# Patient Record
Sex: Female | Born: 1960
Health system: Southern US, Community
[De-identification: ages and names within clinical notes are randomized; demographics above are authoritative.]

## PROBLEM LIST (undated history)

## (undated) DIAGNOSIS — I739 Peripheral vascular disease, unspecified: Secondary | ICD-10-CM

## (undated) DIAGNOSIS — I509 Heart failure, unspecified: Secondary | ICD-10-CM

## (undated) DIAGNOSIS — E785 Hyperlipidemia, unspecified: Secondary | ICD-10-CM

## (undated) HISTORY — DX: Hyperlipidemia, unspecified: E78.5

## (undated) HISTORY — DX: Peripheral vascular disease, unspecified: I73.9

---

## 2020-03-17 ENCOUNTER — Emergency Department (HOSPITAL_COMMUNITY): Payer: Medicare Other

## 2020-03-17 ENCOUNTER — Encounter (HOSPITAL_COMMUNITY)
Admission: EM | Disposition: A | Discharge status: Home Health | Payer: Self-pay | Source: Home / Self Care | Attending: Vascular Surgery

## 2020-03-17 ENCOUNTER — Emergency Department (HOSPITAL_COMMUNITY): Payer: Medicare Other | Admitting: Certified Registered Nurse Anesthetist

## 2020-03-17 ENCOUNTER — Other Ambulatory Visit: Payer: Self-pay

## 2020-03-17 ENCOUNTER — Encounter (HOSPITAL_COMMUNITY): Payer: Self-pay | Admitting: Emergency Medicine

## 2020-03-17 ENCOUNTER — Inpatient Hospital Stay (HOSPITAL_COMMUNITY)
Admission: EM | Admit: 2020-03-17 | Discharge: 2020-03-22 | DRG: 270 | Disposition: A | Discharge status: Home Health | Payer: Medicare Other | Attending: Vascular Surgery | Admitting: Vascular Surgery

## 2020-03-17 DIAGNOSIS — Z79899 Other long term (current) drug therapy: Secondary | ICD-10-CM | POA: Diagnosis not present

## 2020-03-17 DIAGNOSIS — I4892 Unspecified atrial flutter: Secondary | ICD-10-CM | POA: Diagnosis present

## 2020-03-17 DIAGNOSIS — J9601 Acute respiratory failure with hypoxia: Secondary | ICD-10-CM | POA: Diagnosis present

## 2020-03-17 DIAGNOSIS — U071 COVID-19: Secondary | ICD-10-CM | POA: Diagnosis present

## 2020-03-17 DIAGNOSIS — Z8249 Family history of ischemic heart disease and other diseases of the circulatory system: Secondary | ICD-10-CM | POA: Diagnosis not present

## 2020-03-17 DIAGNOSIS — I340 Nonrheumatic mitral (valve) insufficiency: Secondary | ICD-10-CM | POA: Diagnosis not present

## 2020-03-17 DIAGNOSIS — I998 Other disorder of circulatory system: Secondary | ICD-10-CM | POA: Diagnosis present

## 2020-03-17 DIAGNOSIS — I130 Hypertensive heart and chronic kidney disease with heart failure and stage 1 through stage 4 chronic kidney disease, or unspecified chronic kidney disease: Secondary | ICD-10-CM | POA: Diagnosis present

## 2020-03-17 DIAGNOSIS — R7989 Other specified abnormal findings of blood chemistry: Secondary | ICD-10-CM | POA: Diagnosis present

## 2020-03-17 DIAGNOSIS — J1282 Pneumonia due to coronavirus disease 2019: Secondary | ICD-10-CM | POA: Diagnosis present

## 2020-03-17 DIAGNOSIS — N179 Acute kidney failure, unspecified: Secondary | ICD-10-CM | POA: Diagnosis present

## 2020-03-17 DIAGNOSIS — I081 Rheumatic disorders of both mitral and tricuspid valves: Secondary | ICD-10-CM | POA: Diagnosis present

## 2020-03-17 DIAGNOSIS — R7303 Prediabetes: Secondary | ICD-10-CM | POA: Diagnosis present

## 2020-03-17 DIAGNOSIS — R0602 Shortness of breath: Secondary | ICD-10-CM

## 2020-03-17 DIAGNOSIS — N183 Chronic kidney disease, stage 3 unspecified: Secondary | ICD-10-CM | POA: Diagnosis present

## 2020-03-17 DIAGNOSIS — Z23 Encounter for immunization: Secondary | ICD-10-CM

## 2020-03-17 DIAGNOSIS — I8290 Acute embolism and thrombosis of unspecified vein: Secondary | ICD-10-CM | POA: Diagnosis not present

## 2020-03-17 DIAGNOSIS — I361 Nonrheumatic tricuspid (valve) insufficiency: Secondary | ICD-10-CM | POA: Diagnosis not present

## 2020-03-17 DIAGNOSIS — I4891 Unspecified atrial fibrillation: Secondary | ICD-10-CM | POA: Diagnosis not present

## 2020-03-17 DIAGNOSIS — I509 Heart failure, unspecified: Secondary | ICD-10-CM | POA: Diagnosis not present

## 2020-03-17 DIAGNOSIS — D6859 Other primary thrombophilia: Secondary | ICD-10-CM | POA: Diagnosis present

## 2020-03-17 DIAGNOSIS — I82461 Acute embolism and thrombosis of right calf muscular vein: Secondary | ICD-10-CM | POA: Diagnosis present

## 2020-03-17 DIAGNOSIS — I745 Embolism and thrombosis of iliac artery: Principal | ICD-10-CM | POA: Diagnosis present

## 2020-03-17 DIAGNOSIS — I743 Embolism and thrombosis of arteries of the lower extremities: Secondary | ICD-10-CM | POA: Diagnosis present

## 2020-03-17 DIAGNOSIS — I480 Paroxysmal atrial fibrillation: Secondary | ICD-10-CM | POA: Diagnosis present

## 2020-03-17 DIAGNOSIS — I82431 Acute embolism and thrombosis of right popliteal vein: Secondary | ICD-10-CM | POA: Diagnosis present

## 2020-03-17 DIAGNOSIS — I5041 Acute combined systolic (congestive) and diastolic (congestive) heart failure: Secondary | ICD-10-CM | POA: Diagnosis present

## 2020-03-17 DIAGNOSIS — E785 Hyperlipidemia, unspecified: Secondary | ICD-10-CM | POA: Diagnosis present

## 2020-03-17 DIAGNOSIS — I10 Essential (primary) hypertension: Secondary | ICD-10-CM | POA: Diagnosis not present

## 2020-03-17 HISTORY — DX: Heart failure, unspecified: I50.9

## 2020-03-17 HISTORY — PX: THROMBECTOMY OF BYPASS GRAFT FEMORAL- TIBIAL ARTERY: SHX6904

## 2020-03-17 LAB — CBC WITH DIFFERENTIAL/PLATELET
Abs Immature Granulocytes: 0.22 10*3/uL — ABNORMAL HIGH (ref 0.00–0.07)
Basophils Absolute: 0 10*3/uL (ref 0.0–0.1)
Basophils Relative: 0 %
Eosinophils Absolute: 0.1 10*3/uL (ref 0.0–0.5)
Eosinophils Relative: 2 %
HCT: 45.6 % (ref 36.0–46.0)
Hemoglobin: 15 g/dL (ref 12.0–15.0)
Immature Granulocytes: 3 %
Lymphocytes Relative: 11 %
Lymphs Abs: 0.9 10*3/uL (ref 0.7–4.0)
MCH: 33.9 pg (ref 26.0–34.0)
MCHC: 32.9 g/dL (ref 30.0–36.0)
MCV: 102.9 fL — ABNORMAL HIGH (ref 80.0–100.0)
Monocytes Absolute: 0.5 10*3/uL (ref 0.1–1.0)
Monocytes Relative: 6 %
Neutro Abs: 6.5 10*3/uL (ref 1.7–7.7)
Neutrophils Relative %: 78 %
Platelets: 409 10*3/uL — ABNORMAL HIGH (ref 150–400)
RBC: 4.43 MIL/uL (ref 3.87–5.11)
RDW: 12.1 % (ref 11.5–15.5)
WBC: 8.3 10*3/uL (ref 4.0–10.5)
nRBC: 0 % (ref 0.0–0.2)

## 2020-03-17 LAB — BASIC METABOLIC PANEL
Anion gap: 13 (ref 5–15)
BUN: 51 mg/dL — ABNORMAL HIGH (ref 8–23)
CO2: 21 mmol/L — ABNORMAL LOW (ref 22–32)
Calcium: 8.4 mg/dL — ABNORMAL LOW (ref 8.9–10.3)
Chloride: 101 mmol/L (ref 98–111)
Creatinine, Ser: 1.4 mg/dL — ABNORMAL HIGH (ref 0.44–1.00)
GFR, Estimated: 43 mL/min — ABNORMAL LOW (ref >60)
Glucose, Bld: 157 mg/dL — ABNORMAL HIGH (ref 70–99)
Potassium: 3.7 mmol/L (ref 3.5–5.1)
Sodium: 135 mmol/L (ref 135–145)

## 2020-03-17 LAB — PROCALCITONIN: Procalcitonin: 0.54 ng/mL

## 2020-03-17 LAB — PROTIME-INR
INR: 1.3 — ABNORMAL HIGH (ref 0.8–1.2)
Prothrombin Time: 15.4 seconds — ABNORMAL HIGH (ref 11.4–15.2)

## 2020-03-17 LAB — RESP PANEL BY RT-PCR (FLU A&B, COVID) ARPGX2
Influenza A by PCR: NEGATIVE
Influenza B by PCR: NEGATIVE
SARS Coronavirus 2 by RT PCR: POSITIVE — AB

## 2020-03-17 LAB — CK: Total CK: 41 U/L (ref 38–234)

## 2020-03-17 LAB — LACTIC ACID, PLASMA: Lactic Acid, Venous: 2.7 mmol/L (ref 0.5–1.9)

## 2020-03-17 LAB — HIV ANTIBODY (ROUTINE TESTING W REFLEX): HIV Screen 4th Generation wRfx: NONREACTIVE

## 2020-03-17 LAB — HEPATITIS B SURFACE ANTIGEN: Hepatitis B Surface Ag: NONREACTIVE

## 2020-03-17 LAB — APTT: aPTT: 35 seconds (ref 24–36)

## 2020-03-17 LAB — D-DIMER, QUANTITATIVE: D-Dimer, Quant: 18.06 ug/mL-FEU — ABNORMAL HIGH (ref 0.00–0.50)

## 2020-03-17 LAB — LACTATE DEHYDROGENASE: LDH: 617 U/L — ABNORMAL HIGH (ref 98–192)

## 2020-03-17 LAB — BRAIN NATRIURETIC PEPTIDE: B Natriuretic Peptide: 235.9 pg/mL — ABNORMAL HIGH (ref 0.0–100.0)

## 2020-03-17 LAB — GLUCOSE, CAPILLARY: Glucose-Capillary: 175 mg/dL — ABNORMAL HIGH (ref 70–99)

## 2020-03-17 LAB — HEPARIN LEVEL (UNFRACTIONATED): Heparin Unfractionated: 1.44 IU/mL — ABNORMAL HIGH (ref 0.30–0.70)

## 2020-03-17 LAB — C-REACTIVE PROTEIN: CRP: 18.7 mg/dL — ABNORMAL HIGH (ref <1.0)

## 2020-03-17 SURGERY — THROMBECTOMY, BYPASS GRAFT, ARTERIAL, FEMORAL TO TIBIAL
Anesthesia: General | Site: Leg Upper | Laterality: Right

## 2020-03-17 MED ORDER — SIMVASTATIN 20 MG PO TABS
40.0000 mg | ORAL_TABLET | Freq: Every day | ORAL | Status: DC
  Administered 2020-03-17 – 2020-03-21 (×5): 40 mg via ORAL
  Filled 2020-03-17 (×5): qty 2

## 2020-03-17 MED ORDER — PHENYLEPHRINE 40 MCG/ML (10ML) SYRINGE FOR IV PUSH (FOR BLOOD PRESSURE SUPPORT)
PREFILLED_SYRINGE | INTRAVENOUS | Status: AC
  Filled 2020-03-17: qty 10

## 2020-03-17 MED ORDER — METOPROLOL TARTRATE 5 MG/5ML IV SOLN: 2.0000 mg | INTRAVENOUS | Status: DC | PRN

## 2020-03-17 MED ORDER — PREDNISONE 5 MG PO TABS: 50.0000 mg | ORAL_TABLET | Freq: Every day | ORAL | Status: DC

## 2020-03-17 MED ORDER — AMIODARONE HCL IN DEXTROSE 360-4.14 MG/200ML-% IV SOLN
60.0000 mg/h | INTRAVENOUS | Status: AC
  Administered 2020-03-17: 60 mg/h via INTRAVENOUS
  Filled 2020-03-17 (×2): qty 200

## 2020-03-17 MED ORDER — INSULIN ASPART 100 UNIT/ML ~~LOC~~ SOLN: 0.0000 [IU] | Freq: Every day | SUBCUTANEOUS | Status: DC

## 2020-03-17 MED ORDER — VASOPRESSIN 20 UNIT/ML IV SOLN
INTRAVENOUS | Status: AC
  Filled 2020-03-17: qty 1

## 2020-03-17 MED ORDER — HEPARIN (PORCINE) 25000 UT/250ML-% IV SOLN
900.0000 [IU]/h | INTRAVENOUS | Status: DC
  Administered 2020-03-17: 800 [IU]/h via INTRAVENOUS
  Filled 2020-03-17: qty 250

## 2020-03-17 MED ORDER — BISACODYL 5 MG PO TBEC
5.0000 mg | DELAYED_RELEASE_TABLET | Freq: Every day | ORAL | Status: DC | PRN
  Administered 2020-03-21: 5 mg via ORAL
  Filled 2020-03-17: qty 1

## 2020-03-17 MED ORDER — SENNOSIDES-DOCUSATE SODIUM 8.6-50 MG PO TABS: 1.0000 | ORAL_TABLET | Freq: Every evening | ORAL | Status: DC | PRN

## 2020-03-17 MED ORDER — FUROSEMIDE 10 MG/ML IJ SOLN
40.0000 mg | Freq: Once | INTRAMUSCULAR | Status: AC
  Administered 2020-03-17: 40 mg via INTRAVENOUS
  Filled 2020-03-17: qty 4

## 2020-03-17 MED ORDER — DOCUSATE SODIUM 100 MG PO CAPS
100.0000 mg | ORAL_CAPSULE | Freq: Every day | ORAL | Status: DC
  Administered 2020-03-18 – 2020-03-21 (×4): 100 mg via ORAL
  Filled 2020-03-17 (×5): qty 1

## 2020-03-17 MED ORDER — CEFAZOLIN SODIUM-DEXTROSE 2-4 GM/100ML-% IV SOLN
2.0000 g | Freq: Three times a day (TID) | INTRAVENOUS | Status: AC
  Administered 2020-03-17 – 2020-03-18 (×2): 2 g via INTRAVENOUS
  Filled 2020-03-17 (×4): qty 100

## 2020-03-17 MED ORDER — ALUM & MAG HYDROXIDE-SIMETH 200-200-20 MG/5ML PO SUSP: 15.0000 mL | ORAL | Status: DC | PRN

## 2020-03-17 MED ORDER — SODIUM CHLORIDE 0.9 % IR SOLN
Status: DC | PRN
  Administered 2020-03-17: 2000 mL

## 2020-03-17 MED ORDER — MORPHINE SULFATE (PF) 4 MG/ML IV SOLN
4.0000 mg | Freq: Once | INTRAVENOUS | Status: AC
  Administered 2020-03-17: 4 mg via INTRAVENOUS
  Filled 2020-03-17: qty 1

## 2020-03-17 MED ORDER — SODIUM CHLORIDE 0.9 % IV SOLN: 500.0000 mL | Freq: Once | INTRAVENOUS | Status: DC | PRN

## 2020-03-17 MED ORDER — POTASSIUM CHLORIDE CRYS ER 20 MEQ PO TBCR: 20.0000 meq | EXTENDED_RELEASE_TABLET | Freq: Every day | ORAL | Status: DC | PRN

## 2020-03-17 MED ORDER — ASCORBIC ACID 500 MG PO TABS
500.0000 mg | ORAL_TABLET | Freq: Every day | ORAL | Status: DC
  Administered 2020-03-18 – 2020-03-22 (×5): 500 mg via ORAL
  Filled 2020-03-17 (×5): qty 1

## 2020-03-17 MED ORDER — LACTATED RINGERS IV SOLN: INTRAVENOUS | Status: DC | PRN

## 2020-03-17 MED ORDER — FENTANYL CITRATE (PF) 250 MCG/5ML IJ SOLN
INTRAMUSCULAR | Status: DC | PRN
  Administered 2020-03-17: 100 ug via INTRAVENOUS
  Administered 2020-03-17: 50 ug via INTRAVENOUS
  Administered 2020-03-17: 25 ug via INTRAVENOUS

## 2020-03-17 MED ORDER — PROPOFOL 10 MG/ML IV BOLUS
INTRAVENOUS | Status: DC | PRN
  Administered 2020-03-17: 110 mg via INTRAVENOUS

## 2020-03-17 MED ORDER — ALBUTEROL SULFATE HFA 108 (90 BASE) MCG/ACT IN AERS
2.0000 | INHALATION_SPRAY | Freq: Four times a day (QID) | RESPIRATORY_TRACT | Status: DC
  Administered 2020-03-17 – 2020-03-22 (×19): 2 via RESPIRATORY_TRACT
  Filled 2020-03-17: qty 6.7

## 2020-03-17 MED ORDER — SODIUM CHLORIDE (PF) 0.9 % IJ SOLN
INTRAMUSCULAR | Status: AC
  Filled 2020-03-17: qty 50

## 2020-03-17 MED ORDER — HYDRALAZINE HCL 20 MG/ML IJ SOLN: 5.0000 mg | INTRAMUSCULAR | Status: DC | PRN

## 2020-03-17 MED ORDER — OXYCODONE HCL 5 MG/5ML PO SOLN: 5.0000 mg | Freq: Once | ORAL | Status: DC | PRN

## 2020-03-17 MED ORDER — OXYCODONE HCL 5 MG PO TABS
5.0000 mg | ORAL_TABLET | ORAL | Status: DC | PRN
  Administered 2020-03-18 (×4): 5 mg via ORAL
  Administered 2020-03-20: 10 mg via ORAL
  Administered 2020-03-20: 5 mg via ORAL
  Administered 2020-03-20 – 2020-03-22 (×5): 10 mg via ORAL
  Filled 2020-03-17: qty 2
  Filled 2020-03-17 (×3): qty 1
  Filled 2020-03-17: qty 2
  Filled 2020-03-17 (×2): qty 1
  Filled 2020-03-17: qty 2
  Filled 2020-03-17: qty 1
  Filled 2020-03-17 (×3): qty 2

## 2020-03-17 MED ORDER — HYDROCOD POLST-CPM POLST ER 10-8 MG/5ML PO SUER: 5.0000 mL | Freq: Two times a day (BID) | ORAL | Status: DC | PRN

## 2020-03-17 MED ORDER — PHENYLEPHRINE HCL-NACL 10-0.9 MG/250ML-% IV SOLN
INTRAVENOUS | Status: DC | PRN
  Administered 2020-03-17: 50 ug/min via INTRAVENOUS

## 2020-03-17 MED ORDER — ONDANSETRON HCL 4 MG/2ML IJ SOLN
4.0000 mg | Freq: Once | INTRAMUSCULAR | Status: AC
  Administered 2020-03-17: 4 mg via INTRAVENOUS
  Filled 2020-03-17: qty 2

## 2020-03-17 MED ORDER — OXYCODONE HCL 5 MG PO TABS: 5.0000 mg | ORAL_TABLET | Freq: Once | ORAL | Status: DC | PRN

## 2020-03-17 MED ORDER — ONDANSETRON HCL 4 MG/2ML IJ SOLN
INTRAMUSCULAR | Status: AC
  Filled 2020-03-17: qty 2

## 2020-03-17 MED ORDER — SODIUM CHLORIDE 0.9 % IV SOLN
INTRAVENOUS | Status: AC
  Filled 2020-03-17: qty 1.2

## 2020-03-17 MED ORDER — ACETAMINOPHEN 160 MG/5ML PO SOLN: 1000.0000 mg | Freq: Once | ORAL | Status: DC | PRN

## 2020-03-17 MED ORDER — ROCURONIUM BROMIDE 10 MG/ML (PF) SYRINGE
PREFILLED_SYRINGE | INTRAVENOUS | Status: DC | PRN
  Administered 2020-03-17: 20 mg via INTRAVENOUS
  Administered 2020-03-17: 50 mg via INTRAVENOUS

## 2020-03-17 MED ORDER — MIDAZOLAM HCL 2 MG/2ML IJ SOLN
INTRAMUSCULAR | Status: AC
  Filled 2020-03-17: qty 2

## 2020-03-17 MED ORDER — EPHEDRINE 5 MG/ML INJ
INTRAVENOUS | Status: AC
  Filled 2020-03-17: qty 10

## 2020-03-17 MED ORDER — ONDANSETRON HCL 4 MG/2ML IJ SOLN: 4.0000 mg | Freq: Four times a day (QID) | INTRAMUSCULAR | Status: DC | PRN

## 2020-03-17 MED ORDER — SODIUM CHLORIDE 0.9 % IV SOLN: INTRAVENOUS | Status: DC

## 2020-03-17 MED ORDER — FLEET ENEMA 7-19 GM/118ML RE ENEM: 1.0000 | ENEMA | Freq: Once | RECTAL | Status: DC | PRN

## 2020-03-17 MED ORDER — METHYLPREDNISOLONE SODIUM SUCC 125 MG IJ SOLR
0.5000 mg/kg | Freq: Two times a day (BID) | INTRAMUSCULAR | Status: AC
  Administered 2020-03-17 – 2020-03-20 (×6): 45 mg via INTRAVENOUS
  Filled 2020-03-17 (×6): qty 2

## 2020-03-17 MED ORDER — AMIODARONE HCL IN DEXTROSE 360-4.14 MG/200ML-% IV SOLN
30.0000 mg/h | INTRAVENOUS | Status: DC
  Administered 2020-03-17 – 2020-03-18 (×3): 30 mg/h via INTRAVENOUS
  Filled 2020-03-17 (×6): qty 200

## 2020-03-17 MED ORDER — ALBUMIN HUMAN 5 % IV SOLN: INTRAVENOUS | Status: DC | PRN

## 2020-03-17 MED ORDER — PROPOFOL 10 MG/ML IV BOLUS
INTRAVENOUS | Status: AC
  Filled 2020-03-17: qty 20

## 2020-03-17 MED ORDER — SUGAMMADEX SODIUM 200 MG/2ML IV SOLN
INTRAVENOUS | Status: DC | PRN
  Administered 2020-03-17: 200 mg via INTRAVENOUS

## 2020-03-17 MED ORDER — ASPIRIN EC 81 MG PO TBEC
81.0000 mg | DELAYED_RELEASE_TABLET | Freq: Every day | ORAL | Status: DC
  Administered 2020-03-18 – 2020-03-22 (×5): 81 mg via ORAL
  Filled 2020-03-17 (×5): qty 1

## 2020-03-17 MED ORDER — HEPARIN BOLUS VIA INFUSION
4000.0000 [IU] | Freq: Once | INTRAVENOUS | Status: AC
  Administered 2020-03-17: 4000 [IU] via INTRAVENOUS
  Filled 2020-03-17: qty 4000

## 2020-03-17 MED ORDER — DEXAMETHASONE SODIUM PHOSPHATE 10 MG/ML IJ SOLN
INTRAMUSCULAR | Status: AC
  Filled 2020-03-17: qty 1

## 2020-03-17 MED ORDER — DEXAMETHASONE SODIUM PHOSPHATE 10 MG/ML IJ SOLN
INTRAMUSCULAR | Status: DC | PRN
  Administered 2020-03-17: 5 mg via INTRAVENOUS

## 2020-03-17 MED ORDER — ACETAMINOPHEN 10 MG/ML IV SOLN: 1000.0000 mg | Freq: Once | INTRAVENOUS | Status: DC | PRN

## 2020-03-17 MED ORDER — CARVEDILOL 12.5 MG PO TABS
12.5000 mg | ORAL_TABLET | Freq: Two times a day (BID) | ORAL | Status: DC
  Administered 2020-03-17 – 2020-03-22 (×10): 12.5 mg via ORAL
  Filled 2020-03-17 (×10): qty 1

## 2020-03-17 MED ORDER — CEFAZOLIN SODIUM-DEXTROSE 2-3 GM-%(50ML) IV SOLR
INTRAVENOUS | Status: DC | PRN
  Administered 2020-03-17: 2 g via INTRAVENOUS

## 2020-03-17 MED ORDER — ACETAMINOPHEN 325 MG PO TABS: 325.0000 mg | ORAL_TABLET | ORAL | Status: DC | PRN

## 2020-03-17 MED ORDER — FENTANYL CITRATE (PF) 100 MCG/2ML IJ SOLN: 25.0000 ug | INTRAMUSCULAR | Status: DC | PRN

## 2020-03-17 MED ORDER — ONDANSETRON HCL 4 MG/2ML IJ SOLN
INTRAMUSCULAR | Status: DC | PRN
  Administered 2020-03-17: 4 mg via INTRAVENOUS

## 2020-03-17 MED ORDER — HEPARIN (PORCINE) 25000 UT/250ML-% IV SOLN
1100.0000 [IU]/h | INTRAVENOUS | Status: DC
  Administered 2020-03-17: 1100 [IU]/h via INTRAVENOUS
  Filled 2020-03-17: qty 250

## 2020-03-17 MED ORDER — PHENYLEPHRINE 40 MCG/ML (10ML) SYRINGE FOR IV PUSH (FOR BLOOD PRESSURE SUPPORT)
PREFILLED_SYRINGE | INTRAVENOUS | Status: DC | PRN
  Administered 2020-03-17: 40 ug via INTRAVENOUS
  Administered 2020-03-17 (×2): 80 ug via INTRAVENOUS
  Administered 2020-03-17 (×2): 120 ug via INTRAVENOUS
  Administered 2020-03-17: 80 ug via INTRAVENOUS

## 2020-03-17 MED ORDER — AMIODARONE LOAD VIA INFUSION
150.0000 mg | Freq: Once | INTRAVENOUS | Status: AC
  Administered 2020-03-17: 150 mg via INTRAVENOUS
  Filled 2020-03-17: qty 83.34

## 2020-03-17 MED ORDER — INSULIN ASPART 100 UNIT/ML ~~LOC~~ SOLN
0.0000 [IU] | Freq: Three times a day (TID) | SUBCUTANEOUS | Status: DC
  Administered 2020-03-18 – 2020-03-20 (×6): 2 [IU] via SUBCUTANEOUS
  Administered 2020-03-20: 1 [IU] via SUBCUTANEOUS
  Administered 2020-03-21 (×2): 2 [IU] via SUBCUTANEOUS

## 2020-03-17 MED ORDER — IOHEXOL 350 MG/ML SOLN
100.0000 mL | Freq: Once | INTRAVENOUS | Status: AC | PRN
  Administered 2020-03-17: 80 mL via INTRAVENOUS

## 2020-03-17 MED ORDER — LISINOPRIL 20 MG PO TABS: 20.0000 mg | ORAL_TABLET | Freq: Every day | ORAL | Status: DC

## 2020-03-17 MED ORDER — SODIUM CHLORIDE 0.9 % IV SOLN
200.0000 mg | Freq: Once | INTRAVENOUS | Status: AC
  Administered 2020-03-17: 200 mg via INTRAVENOUS
  Filled 2020-03-17: qty 40

## 2020-03-17 MED ORDER — SODIUM CHLORIDE 0.9 % IV SOLN
100.0000 mg | Freq: Every day | INTRAVENOUS | Status: AC
  Administered 2020-03-18 – 2020-03-21 (×4): 100 mg via INTRAVENOUS
  Filled 2020-03-17 (×4): qty 20

## 2020-03-17 MED ORDER — ACETAMINOPHEN 650 MG RE SUPP: 325.0000 mg | RECTAL | Status: DC | PRN

## 2020-03-17 MED ORDER — SUCCINYLCHOLINE CHLORIDE 200 MG/10ML IV SOSY
PREFILLED_SYRINGE | INTRAVENOUS | Status: DC | PRN
  Administered 2020-03-17: 100 mg via INTRAVENOUS

## 2020-03-17 MED ORDER — PANTOPRAZOLE SODIUM 40 MG PO TBEC
40.0000 mg | DELAYED_RELEASE_TABLET | Freq: Every day | ORAL | Status: DC
  Administered 2020-03-18 – 2020-03-22 (×5): 40 mg via ORAL
  Filled 2020-03-17 (×5): qty 1

## 2020-03-17 MED ORDER — ZINC SULFATE 220 (50 ZN) MG PO CAPS
220.0000 mg | ORAL_CAPSULE | Freq: Every day | ORAL | Status: DC
  Administered 2020-03-17 – 2020-03-22 (×6): 220 mg via ORAL
  Filled 2020-03-17 (×6): qty 1

## 2020-03-17 MED ORDER — CHLORHEXIDINE GLUCONATE CLOTH 2 % EX PADS
6.0000 | MEDICATED_PAD | Freq: Every day | CUTANEOUS | Status: DC
  Administered 2020-03-17 – 2020-03-22 (×5): 6 via TOPICAL

## 2020-03-17 MED ORDER — HYDROMORPHONE HCL 1 MG/ML IJ SOLN: 0.5000 mg | INTRAMUSCULAR | Status: DC | PRN

## 2020-03-17 MED ORDER — MAGNESIUM SULFATE 2 GM/50ML IV SOLN: 2.0000 g | Freq: Every day | INTRAVENOUS | Status: DC | PRN

## 2020-03-17 MED ORDER — GUAIFENESIN-DM 100-10 MG/5ML PO SYRP: 15.0000 mL | ORAL_SOLUTION | ORAL | Status: DC | PRN

## 2020-03-17 MED ORDER — IODIXANOL 320 MG/ML IV SOLN
INTRAVENOUS | Status: DC | PRN
  Administered 2020-03-17: 50 mL via INTRAVENOUS

## 2020-03-17 MED ORDER — ACETAMINOPHEN 500 MG PO TABS: 1000.0000 mg | ORAL_TABLET | Freq: Once | ORAL | Status: DC | PRN

## 2020-03-17 MED ORDER — VASOPRESSIN 20 UNIT/ML IV SOLN
INTRAVENOUS | Status: DC | PRN
  Administered 2020-03-17: 1 [IU] via INTRAVENOUS

## 2020-03-17 MED ORDER — HEPARIN SODIUM (PORCINE) 1000 UNIT/ML IJ SOLN
INTRAMUSCULAR | Status: DC | PRN
  Administered 2020-03-17: 2000 [IU] via INTRAVENOUS
  Administered 2020-03-17: 5000 [IU] via INTRAVENOUS
  Administered 2020-03-17: 2000 [IU] via INTRAVENOUS

## 2020-03-17 MED ORDER — LABETALOL HCL 5 MG/ML IV SOLN: 10.0000 mg | INTRAVENOUS | Status: DC | PRN

## 2020-03-17 MED ORDER — SODIUM CHLORIDE 0.9 % IV SOLN
INTRAVENOUS | Status: DC | PRN
  Administered 2020-03-17: 11:00:00 500 mL

## 2020-03-17 MED ORDER — PHENOL 1.4 % MT LIQD: 1.0000 | OROMUCOSAL | Status: DC | PRN

## 2020-03-17 MED ORDER — FENTANYL CITRATE (PF) 250 MCG/5ML IJ SOLN
INTRAMUSCULAR | Status: AC
  Filled 2020-03-17: qty 5

## 2020-03-17 SURGICAL SUPPLY — 84 items
ARMBAND PINK RESTRICT EXTREMIT (MISCELLANEOUS) ×3 IMPLANT
BANDAGE ESMARK 6X9 LF (GAUZE/BANDAGES/DRESSINGS) IMPLANT
BENZOIN TINCTURE PRP APPL 2/3 (GAUZE/BANDAGES/DRESSINGS) ×3 IMPLANT
BNDG ESMARK 6X9 LF (GAUZE/BANDAGES/DRESSINGS)
CANISTER SUCT 3000ML PPV (MISCELLANEOUS) ×6 IMPLANT
CANNULA VESSEL 3MM 2 BLNT TIP (CANNULA) ×3 IMPLANT
CATH EMB 3FR 80CM (CATHETERS) ×3 IMPLANT
CATH EMB 4FR 80CM (CATHETERS) ×3 IMPLANT
CATH EMB 5FR 80CM (CATHETERS) IMPLANT
CHLORAPREP W/TINT 26 (MISCELLANEOUS) ×6 IMPLANT
CLIP LIGATING EXTRA MED SLVR (CLIP) IMPLANT
CLIP LIGATING EXTRA SM BLUE (MISCELLANEOUS) IMPLANT
CLIP VESOCCLUDE MED 24/CT (CLIP) ×3 IMPLANT
CLIP VESOCCLUDE SM WIDE 24/CT (CLIP) ×3 IMPLANT
COVER WAND RF STERILE (DRAPES) IMPLANT
CUFF TOURN SGL QUICK 24 (TOURNIQUET CUFF)
CUFF TOURN SGL QUICK 34 (TOURNIQUET CUFF)
CUFF TOURN SGL QUICK 42 (TOURNIQUET CUFF) IMPLANT
CUFF TRNQT CYL 24X4X16.5-23 (TOURNIQUET CUFF) IMPLANT
CUFF TRNQT CYL 34X4.125X (TOURNIQUET CUFF) IMPLANT
DECANTER SPIKE VIAL GLASS SM (MISCELLANEOUS) ×3 IMPLANT
DERMABOND ADVANCED (GAUZE/BANDAGES/DRESSINGS) ×1
DERMABOND ADVANCED .7 DNX12 (GAUZE/BANDAGES/DRESSINGS) ×2 IMPLANT
DRAIN CHANNEL 15F RND FF W/TCR (WOUND CARE) IMPLANT
DRAPE C-ARM 42X72 X-RAY (DRAPES) ×3 IMPLANT
DRAPE HALF SHEET 40X57 (DRAPES) IMPLANT
DRAPE X-RAY CASS 24X20 (DRAPES) IMPLANT
DRSG AQUACEL AG ADV 3.5X 6 (GAUZE/BANDAGES/DRESSINGS) ×9 IMPLANT
ELECT REM PT RETURN 9FT ADLT (ELECTROSURGICAL) ×6
ELECTRODE REM PT RTRN 9FT ADLT (ELECTROSURGICAL) ×4 IMPLANT
EVACUATOR SILICONE 100CC (DRAIN) IMPLANT
GAUZE SPONGE 4X4 12PLY STRL (GAUZE/BANDAGES/DRESSINGS) ×3 IMPLANT
GLOVE BIO SURGEON STRL SZ 6.5 (GLOVE) ×6 IMPLANT
GLOVE BIO SURGEON STRL SZ7.5 (GLOVE) ×3 IMPLANT
GLOVE BIOGEL PI IND STRL 6.5 (GLOVE) ×8 IMPLANT
GLOVE BIOGEL PI IND STRL 7.5 (GLOVE) ×4 IMPLANT
GLOVE BIOGEL PI INDICATOR 6.5 (GLOVE) ×4
GLOVE BIOGEL PI INDICATOR 7.5 (GLOVE) ×2
GLOVE ECLIPSE 7.0 STRL STRAW (GLOVE) ×3 IMPLANT
GLOVE SKINSENSE NS SZ8.0 LF (GLOVE) ×2
GLOVE SKINSENSE STRL SZ8.0 LF (GLOVE) ×4 IMPLANT
GLOVE SS BIOGEL STRL SZ 7.5 (GLOVE) IMPLANT
GLOVE SUPERSENSE BIOGEL SZ 7.5 (GLOVE)
GLOVE SURG SS PI 8.0 STRL IVOR (GLOVE) ×3 IMPLANT
GOWN STRL REUS W/ TWL LRG LVL3 (GOWN DISPOSABLE) ×10 IMPLANT
GOWN STRL REUS W/ TWL XL LVL3 (GOWN DISPOSABLE) ×2 IMPLANT
GOWN STRL REUS W/TWL LRG LVL3 (GOWN DISPOSABLE) ×5
GOWN STRL REUS W/TWL XL LVL3 (GOWN DISPOSABLE) ×1
INSERT FOGARTY SM (MISCELLANEOUS) IMPLANT
KIT BASIN OR (CUSTOM PROCEDURE TRAY) ×6 IMPLANT
KIT TURNOVER KIT B (KITS) ×6 IMPLANT
LOOP VESSEL MINI RED (MISCELLANEOUS) ×3 IMPLANT
MARKER GRAFT CORONARY BYPASS (MISCELLANEOUS) IMPLANT
NS IRRIG 1000ML POUR BTL (IV SOLUTION) ×6 IMPLANT
PACK CV ACCESS (CUSTOM PROCEDURE TRAY) IMPLANT
PACK PERIPHERAL VASCULAR (CUSTOM PROCEDURE TRAY) ×3 IMPLANT
PAD ARMBOARD 7.5X6 YLW CONV (MISCELLANEOUS) ×12 IMPLANT
SET COLLECT BLD 21X3/4 12 (NEEDLE) IMPLANT
STAPLER VISISTAT 35W (STAPLE) ×3 IMPLANT
STOPCOCK 4 WAY LG BORE MALE ST (IV SETS) ×3 IMPLANT
STRIP CLOSURE SKIN 1/2X4 (GAUZE/BANDAGES/DRESSINGS) ×3 IMPLANT
SUT ETHILON 3 0 PS 1 (SUTURE) IMPLANT
SUT MNCRL AB 4-0 PS2 18 (SUTURE) ×3 IMPLANT
SUT PROLENE 5 0 C 1 24 (SUTURE) ×9 IMPLANT
SUT PROLENE 6 0 BV (SUTURE) ×9 IMPLANT
SUT PROLENE 6 0 CC (SUTURE) IMPLANT
SUT PROLENE 7 0 BV1 MDA (SUTURE) ×3 IMPLANT
SUT SILK 2 0 SH (SUTURE) IMPLANT
SUT SILK 2 0SH CR/8 30 (SUTURE) ×3 IMPLANT
SUT SILK 3 0 (SUTURE) ×1
SUT SILK 3-0 18XBRD TIE 12 (SUTURE) ×2 IMPLANT
SUT VIC AB 2-0 CT1 27 (SUTURE) ×3
SUT VIC AB 2-0 CT1 TAPERPNT 27 (SUTURE) ×6 IMPLANT
SUT VIC AB 3-0 SH 27 (SUTURE) ×1
SUT VIC AB 3-0 SH 27X BRD (SUTURE) ×2 IMPLANT
SYR 30ML LL (SYRINGE) ×3 IMPLANT
SYR 5ML LL (SYRINGE) ×3 IMPLANT
SYR BULB IRRIG 60ML STRL (SYRINGE) ×3 IMPLANT
TAPE UMBILICAL COTTON 1/8X30 (MISCELLANEOUS) IMPLANT
TOWEL GREEN STERILE (TOWEL DISPOSABLE) ×6 IMPLANT
TRAY FOLEY MTR SLVR 16FR STAT (SET/KITS/TRAYS/PACK) ×3 IMPLANT
TUBING EXTENTION W/L.L. (IV SETS) ×3 IMPLANT
UNDERPAD 30X36 HEAVY ABSORB (UNDERPADS AND DIAPERS) ×3 IMPLANT
WATER STERILE IRR 1000ML POUR (IV SOLUTION) ×6 IMPLANT

## 2020-03-17 NOTE — ED Triage Notes (Signed)
Patient here from home reporting right knee pain and numbness in right leg and foot that started "out of the blue" 12 hours ago.

## 2020-03-17 NOTE — ED Notes (Signed)
Date and time results received: 03/17/20 0458 (use smartphrase ".now" to insert current time)  Test: Lactic Acid Critical Value: 2.7  Name of Provider Notified: Pollina MD  Orders Received? Or Actions Taken?: Waiting on new orders.

## 2020-03-17 NOTE — H&P (Signed)
ASSESSMENT & PLAN:  59 y.o. female with rutherford 2A right lower extremity acute limb ischemia. Likely embolus from new diagnosis of atrial fibrillation.  As such an open thrombectomy is likely the best course.  She will also need four compartment fasciotomies.  We will plan to admit her to telemetry bed with internal medicine consultation was procedure.  Discontinue heparin on-call to the OR.  CHIEF COMPLAINT:   Right leg pain  HISTORY:  HISTORY OF PRESENT ILLNESS: Patricia Atkinson is a 59 y.o. female with no reported past medical history who presented to [facility] emergency department in the early hours of this morning for evaluation of right lower extremity pain.  Patient reports pain is from the knee down.  She has been able to ambulate.  She reports no sensation in her foot.  She reports symptoms began in the afternoon of 03/16/2020.  History reviewed. No pertinent past medical history.  History reviewed. No pertinent surgical history.  No family history on file.  Social History   Socioeconomic History  . Marital status: Widowed    Spouse name: Not on file  . Number of children: Not on file  . Years of education: Not on file  . Highest education level: Not on file  Occupational History  . Not on file  Tobacco Use  . Smoking status: Never Smoker  . Smokeless tobacco: Never Used  Substance and Sexual Activity  . Alcohol use: Never  . Drug use: Never  . Sexual activity: Never  Other Topics Concern  . Not on file  Social History Narrative  . Not on file   Social Determinants of Health   Financial Resource Strain:   . Difficulty of Paying Living Expenses: Not on file  Food Insecurity:   . Worried About Programme researcher, broadcasting/film/video in the Last Year: Not on file  . Ran Out of Food in the Last Year: Not on file  Transportation Needs:   . Lack of Transportation (Medical): Not on file  . Lack of Transportation (Non-Medical): Not on file  Physical Activity:   . Days of  Exercise per Week: Not on file  . Minutes of Exercise per Session: Not on file  Stress:   . Feeling of Stress : Not on file  Social Connections:   . Frequency of Communication with Friends and Family: Not on file  . Frequency of Social Gatherings with Friends and Family: Not on file  . Attends Religious Services: Not on file  . Active Member of Clubs or Organizations: Not on file  . Attends Banker Meetings: Not on file  . Marital Status: Not on file  Intimate Partner Violence:   . Fear of Current or Ex-Partner: Not on file  . Emotionally Abused: Not on file  . Physically Abused: Not on file  . Sexually Abused: Not on file    No Known Allergies  Current Facility-Administered Medications  Medication Dose Route Frequency Provider Last Rate Last Admin  . amiodarone (NEXTERONE PREMIX) 360-4.14 MG/200ML-% (1.8 mg/mL) IV infusion  60 mg/hr Intravenous Continuous Gilda Crease, MD 33.3 mL/hr at 03/17/20 0758 60 mg/hr at 03/17/20 0758   Followed by  . amiodarone (NEXTERONE PREMIX) 360-4.14 MG/200ML-% (1.8 mg/mL) IV infusion  30 mg/hr Intravenous Continuous Pollina, Canary Brim, MD      . heparin ADULT infusion 100 units/mL (25000 units/226mL sodium chloride 0.45%)  1,100 Units/hr Intravenous Continuous Phylliss Blakes, RPH 11 mL/hr at 03/17/20 0720 1,100 Units/hr at 03/17/20 0720  .  sodium chloride (PF) 0.9 % injection            Current Outpatient Medications  Medication Sig Dispense Refill  . carvedilol (COREG) 12.5 MG tablet Take 12.5 mg by mouth 2 (two) times daily with a meal.    . furosemide (LASIX) 40 MG tablet Take 40-80 mg by mouth See admin instructions. 80 MG in the morning and 40 MG in the evening    . lisinopril (ZESTRIL) 20 MG tablet Take 20 mg by mouth daily.    . simvastatin (ZOCOR) 40 MG tablet Take 40 mg by mouth daily.    Marland Kitchen spironolactone (ALDACTONE) 25 MG tablet Take 25 mg by mouth daily.      REVIEW OF SYSTEMS:  [X]  denotes positive  finding, [ ]  denotes negative finding Cardiac  Comments:  Chest pain or chest pressure:    Shortness of breath upon exertion:    Short of breath when lying flat:    Irregular heart rhythm:        Vascular    Pain in calf, thigh, or hip brought on by ambulation:    Pain in feet at night that wakes you up from your sleep:     Blood clot in your veins:    Leg swelling:         Pulmonary    Oxygen at home:    Productive cough:     Wheezing:         Neurologic    Sudden weakness in arms or legs:     Sudden numbness in arms or legs:     Sudden onset of difficulty speaking or slurred speech:    Temporary loss of vision in one eye:     Problems with dizziness:         Gastrointestinal    Blood in stool:     Vomited blood:         Genitourinary    Burning when urinating:     Blood in urine:        Psychiatric    Major depression:         Hematologic    Bleeding problems:    Problems with blood clotting too easily:        Skin    Rashes or ulcers:        Constitutional    Fever or chills:     PHYSICAL EXAM:   Vitals:   03/17/20 0632 03/17/20 0715 03/17/20 0730 03/17/20 0825  BP: (!) 163/109  (!) 149/93 (!) 168/103  Pulse:   (!) 51 87  Resp: (!) 22  (!) 33 (!) 35  Temp:    (!) 97.5 F (36.4 C)  TempSrc:    Oral  SpO2:  94% 95% 94%  Weight:      Height:       Constitutional: Well appearing in no distress. Appears well nourished.  Neurologic: Normal gait and station. CN intact. No weakness. No sensory loss. Psychiatric: Mood and affect symmetric and appropriate. Eyes: No icterus. No conjunctival pallor. Ears, nose, throat: mucous membranes moist. Midline trachea. No carotid bruit. Cardiac: regular rate and rhythm.  Respiratory: unlabored. Abdominal: soft, non-tender, non-distended. No palpable pulsatile abdominal mass. Peripheral vascular:  Dorsalis pedis pulse: L 2+ / R absent  R foot is cool to touch  Motor function intact in R foot  Absent sensation in  the R foot Extremity: No edema. No cyanosis. No pallor.  Skin: No gangrene. No ulceration.  Lymphatic: No Stemmer's  sign. No palpable lymphadenopathy.   DATA REVIEW:    Most recent CBC CBC Latest Ref Rng & Units 03/17/2020  WBC 4.0 - 10.5 K/uL 8.3  Hemoglobin 12.0 - 15.0 g/dL 38.1  Hematocrit 36 - 46 % 45.6  Platelets 150 - 400 K/uL 409(H)     Most recent CMP CMP Latest Ref Rng & Units 03/17/2020  Glucose 70 - 99 mg/dL 829(H)  BUN 8 - 23 mg/dL 37(J)  Creatinine 6.96 - 1.00 mg/dL 7.89(F)  Sodium 810 - 175 mmol/L 135  Potassium 3.5 - 5.1 mmol/L 3.7  Chloride 98 - 111 mmol/L 101  CO2 22 - 32 mmol/L 21(L)  Calcium 8.9 - 10.3 mg/dL 1.0(C)    Renal function Estimated Creatinine Clearance: 47 mL/min (A) (by C-G formula based on SCr of 1.4 mg/dL (H)).  No results found for: HGBA1C  No results found for: LDLCALC, LDLC, HIRISKLDL, POCLDL, LDLDIRECT, REALLDLC, TOTLDLC   Vascular Imaging: CLINICAL DATA:  Right knee pain with right leg and foot numbness for several hours. Elevated D-dimer. No pulses in the right leg.  EXAM: CT ANGIOGRAPHY OF ABDOMINAL AORTA WITH ILIOFEMORAL RUNOFF  TECHNIQUE: Multidetector CT imaging of the abdomen, pelvis and lower extremities was performed using the standard protocol during bolus administration of intravenous contrast. Multiplanar CT image reconstructions and MIPs were obtained to evaluate the vascular anatomy.  CONTRAST:  47mL OMNIPAQUE IOHEXOL 350 MG/ML SOLN  COMPARISON:  None.  FINDINGS: VASCULAR  Aorta: Normal caliber aorta without aneurysm, dissection, vasculitis or significant stenosis. Scattered aortic calcifications.  Celiac: Patent without evidence of aneurysm, dissection, vasculitis or significant stenosis.  SMA: Patent without evidence of aneurysm, dissection, vasculitis or significant stenosis.  Renals: Both renal arteries are patent without evidence of aneurysm, dissection, vasculitis, fibromuscular  dysplasia or significant stenosis. There is a small accessory right renal artery to the lower pole which also appears patent.  IMA: Patent without evidence of aneurysm, dissection, vasculitis or significant stenosis.  RIGHT Lower Extremity  Inflow: Thrombosis demonstrated in the common iliac artery extending into the proximal external iliac artery and into the internal iliac artery. There is a small string sign of flow around the thrombus in the common iliac and external iliac arteries with no flow demonstrated in the internal iliac artery. There is collateral reconstitution of flow to the external iliac artery.  Outflow: Common, superficial and profunda femoral arteries and the popliteal artery are patent without evidence of aneurysm, dissection, vasculitis or significant stenosis.  Runoff: Nonocclusive filling defect in the popliteal artery probably thrombus. There is complete occlusion of the distal popliteal artery with no flow demonstrated in the tibial trunk or trifurcation vessels. 0 vessel runoff to the ankle is demonstrated.  LEFT Lower Extremity  Inflow: Common, internal and external iliac arteries are patent without evidence of aneurysm, dissection, vasculitis or significant stenosis.  Outflow: Common, superficial and profunda femoral arteries and the popliteal artery are patent without evidence of aneurysm, dissection, vasculitis or significant stenosis.  Runoff: Patent three vessel runoff to the ankle.  Veins: No obvious venous abnormality within the limitations of this arterial phase study.  Review of the MIP images confirms the above findings.  NON-VASCULAR  Lower chest: Diffuse airspace disease in the visualized lung bases, likely edema. Pneumonia would be a secondary consideration.  Hepatobiliary: No focal liver abnormality is seen. Status post cholecystectomy. No biliary dilatation.  Pancreas: Unremarkable. No pancreatic ductal  dilatation or surrounding inflammatory changes.  Spleen: Normal in size without focal abnormality.  Adrenals/Urinary Tract: Adrenal glands  are unremarkable. Kidneys are normal, without renal calculi, focal lesion, or hydronephrosis. Bladder is unremarkable.  Stomach/Bowel: Stomach is within normal limits. Appendix appears normal. No evidence of bowel wall thickening, distention, or inflammatory changes.  Lymphatic: No significant lymphadenopathy.  Reproductive: Uterus and bilateral adnexa are unremarkable.  Other: No abdominal wall hernia or abnormality. No abdominopelvic ascites.  Musculoskeletal: No acute or significant osseous findings.  IMPRESSION: VASCULAR  Occlusive filling defect, likely thrombosis in the right common iliac artery extending into the external iliac and internal iliac arteries. Collateral reconstitution of the external iliac artery. Focal nonocclusive filling defect in the right mid popliteal artery. Occlusion of the distal popliteal artery extending into the tibial trunk and trifurcation vessels with no flow demonstrated in the right calf runoff vessels.  NON-VASCULAR  Diffuse airspace disease in the visualized lung bases is likely due to edema.   Rande Brunthomas N. Lenell AntuHawken, MD [facility] Office Phone Number: [phone] 03/17/2020 8:54 AM

## 2020-03-17 NOTE — ED Notes (Signed)
[facility] at bedside 

## 2020-03-17 NOTE — Progress Notes (Signed)
Patient A/Ox4, patient not in any distress, VSS. Noted patient right lower extremity to be cool to the touch and absent pulse via doppler. No hematoma or additional bleeding noted to the 3 incision sites. Will continue to monitor patient.

## 2020-03-17 NOTE — Progress Notes (Signed)
ANTICOAGULATION CONSULT NOTE - Initial Consult  Pharmacy Consult for Heparin Indication: atrial fibrillation  No Known Allergies  Patient Measurements: Height: 5\' 6"  (167.6 cm) Weight: 87.5 kg (193 lb) IBW/kg (Calculated) : 59.3 HEPARIN DW (KG): 78.2  Vital Signs: Temp: 97.4 F (36.3 C) (11/25 0604) Temp Source: Oral (11/25 0604) BP: 156/95 (11/25 0602) Pulse Rate: 84 (11/25 0602)  Labs: Recent Labs    03/17/20 0311  HGB 15.0  HCT 45.6  PLT 409*  APTT 35  LABPROT 15.4*  INR 1.3*  CREATININE 1.40*  CKTOTAL 41    Estimated Creatinine Clearance: 47 mL/min (A) (by C-G formula based on SCr of 1.4 mg/dL (H)).   Medical History: History reviewed. No pertinent past medical history.  Medications:  No anticoagulation PTA  Assessment: Pharmacy consulted to dose heparin for Afib.   Baseline labs WNL  Goal of Therapy:  Heparin level 0.3-0.7 units/ml Monitor platelets by anticoagulation protocol: Yes   Plan:  Heparin 4000 units IV bolus then infusion at 1100 units/hr Check 6h heparin level after heparin starts Daily heparin level & CBC while on heparin  03/19/20 PharmD, BCPS 03/17/2020,6:53 AM

## 2020-03-17 NOTE — Consult Note (Signed)
[facility] Medical Consultation  Rick Warnick QIH:[id number] DOB: [birth year] DOA: 03/17/2020 PCP: Patient, No Pcp Per   Requesting physician: Heath Lark, MD Date of consultation: 03/17/20 Reason for consultation: COVID-19   Impression/Recommendations   1. Thrombus of the right common iliac artery: Acute.  Patient taken for thrombectomy by vascular surgery currently on heparin drip.  Thought to be secondary to patient being in A. fib with COVID-19. -Heparin drip per pharmacy -Per vascular surgery   2. COVID-19 infection: Patient found to be COVID-19 positive.  Reports having recent cough and shortness of breath after going to the funeral last week.  O2 saturations currently maintained on 3 L nasal cannula oxygen.  Diffuse airspace disease noted on CT imaging. -COVID-19 order set utilized -Check inflammatory markers, and monitor daily -Remdesivir per pharmacy -Solu-Medrol IV -Albuterol inhaler -Vitamin C and zinc -CBGs before every meal and at bedtime with sensitive sliding scale insulin while on steroids.  Adjust regimen as needed  3. Atrial fibrillation: Acute.  Patient was initially found to be in atrial fibrillation at 116 bpm.  Placed on amiodarone drip. CHA2DS2-VASc score =5. -Check echocardiogram -Goal potassium 4 and magnesium 2 -Continue heparin per pharmacy  4. Congestive heart failure exacerbation: Patient denies any prior history of congestive heart failure.  Imaging noting concern for pulmonary edema..  Patient currently given 40 mg of Lasix IV x1 dose in the emergency department. -Strict intake and output -Daily weights -Follow-up echocardiogram -Determine if patient needs continued IV diuresis  5. AKI vs. CKD stage III: Patient presents with creatinine of 1.4 with BUN 51.  The elevated BUN to creatinine ratio suggest possibility of prerenal cause of symptoms.  However, baseline is unknown at this time and there is concern for patient possibly being fluid  overloaded. -Continue to monitor kidney function daily  6. Essential hypertension: Blood pressures currently maintained -Continue Coreg -Held lisinopril due to possible acute kidney injury baseline creatinine unknown.  Restart when medically appropriate  7. Hyperlipidemia -Continue statin  TRH will follow-up again tomorrow. Please contact me if I can be of assistance in the meanwhile. Thank you for this consultation.  Chief Complaint: Right leg pain  HPI:  Patricia Atkinson is a 59 y.o. female with medical history significant of hypertension hyperlipidemia who presents with complaints of right leg pain which started yesterday afternoon.  Pain was described as sharp and severe.  Denied having any injury to onset of pain.  Associated symptoms include mild cough, mild short of breath, and diarrhea which started attending a family member's funeral 1 week ago.  Patient notes that she was not vaccinated against COVID-19.  Denies having any significant vomiting or change in smell/taste.  She had gone to the emergency department and was evaluated and found to be in atrial fibrillation.  She was found to have no dopplerable pulses of the right leg and CT revealed an occlusive filling defect of the right common iliac extending into the external and internal iliac arteries and occlusion of the distal popliteal artery.  She was given heparin bolus and started on heparin drip.  Vascular surgery was consulted only admitted the patient for open thrombectomy. Chest x-ray was concerning for mild pulmonary edema and cardiomegaly. COVID-19 screening was positive.  Labs revealed WBC 8.3, hemoglobin 15, platelets 409, BUN 51, creatinine 1.4, lactic acid 2.7, and D-dimer 18.06.   Review of Systems  Constitutional: Positive for malaise/fatigue.  Respiratory: Positive for cough and shortness of breath.   Cardiovascular: Positive for leg swelling.  Gastrointestinal:  Positive for diarrhea. Negative for nausea and vomiting.      History reviewed. No pertinent past medical history. History reviewed. No pertinent surgical history. Social History:  reports that she has never smoked. She has never used smokeless tobacco. She reports that she does not drink alcohol and does not use drugs.  No Known Allergies History reviewed. No pertinent family history.  Prior to Admission medications   Medication Sig Start Date End Date Taking? Authorizing Provider  carvedilol (COREG) 12.5 MG tablet Take 12.5 mg by mouth 2 (two) times daily with a meal.   Yes [provider]  furosemide (LASIX) 40 MG tablet Take 40-80 mg by mouth See admin instructions. 80 MG in the morning and 40 MG in the evening   Yes [provider]  lisinopril (ZESTRIL) 20 MG tablet Take 20 mg by mouth daily.   Yes [provider]  simvastatin (ZOCOR) 40 MG tablet Take 40 mg by mouth daily.   Yes [provider]  spironolactone (ALDACTONE) 25 MG tablet Take 25 mg by mouth daily.   Yes [provider]   Physical Exam:  Constitutional: Older female who appears to be acutely ill-appearing Vitals:   03/17/20 1413 03/17/20 1428 03/17/20 1515 03/17/20 1548  BP: 117/70 127/72  127/67  Pulse: 79 74  72  Resp: 18 18  20   Temp:   97.8 F (36.6 C) 97.7 F (36.5 C)  TempSrc:    Axillary  SpO2: 94% 93%  95%  Weight:    90.1 kg  Height:    5\' 6"  (1.676 m)   Eyes: PERRL, lids and conjunctivae normal ENMT: Mucous membranes are dry. Posterior pharynx clear of any exudate or lesions.  Neck: normal, supple, no masses, no thyromegaly Respiratory: Mildly tachypneic with no significant wheezes or rhonchi appreciated.  Patient currently on 3 L of oxygen with O2 saturations maintained. Cardiovascular: Irregular irregular, no murmurs / rubs / gallops. No extremity edema. 2+ pedal pulses. No carotid bruits.  Abdomen: no tenderness, no masses palpated. No hepatosplenomegaly. Bowel sounds positive.  Musculoskeletal: no clubbing  / cyanosis. No joint deformity upper and lower extremities. Good ROM, no contractures. Normal muscle tone.  Skin: no rashes, lesions, ulcers. No induration Neurologic: CN 2-12 grossly intact. Sensation intact, DTR normal. Strength 5/5 in all 4.  Psychiatric: Normal judgment and insight. Alert and oriented x 3. Normal mood.   Labs on Admission:  Basic Metabolic Panel: Recent Labs  Lab 03/17/20 0311  NA 135  K 3.7  CL 101  CO2 21*  GLUCOSE 157*  BUN 51*  CREATININE 1.40*  CALCIUM 8.4*   Liver Function Tests: No results for input(s): AST, ALT, ALKPHOS, BILITOT, PROT, ALBUMIN in the last 168 hours. No results for input(s): LIPASE, AMYLASE in the last 168 hours. No results for input(s): AMMONIA in the last 168 hours. CBC: Recent Labs  Lab 03/17/20 0311  WBC 8.3  NEUTROABS 6.5  HGB 15.0  HCT 45.6  MCV 102.9*  PLT 409*   Cardiac Enzymes: Recent Labs  Lab 03/17/20 0311  CKTOTAL 41   BNP: Invalid input(s): POCBNP CBG: No results for input(s): GLUCAP in the last 168 hours.  Radiological Exams on Admission: CT Angio Aortobifemoral W and/or Wo Contrast  Result Date: 03/17/2020 CLINICAL DATA:  Right knee pain with right leg and foot numbness for several hours. Elevated D-dimer. No pulses in the right leg. EXAM: CT ANGIOGRAPHY OF ABDOMINAL AORTA WITH ILIOFEMORAL RUNOFF TECHNIQUE: Multidetector CT imaging of the abdomen, pelvis  and lower extremities was performed using the standard protocol during bolus administration of intravenous contrast. Multiplanar CT image reconstructions and MIPs were obtained to evaluate the vascular anatomy. CONTRAST:  31mL OMNIPAQUE IOHEXOL 350 MG/ML SOLN COMPARISON:  None. FINDINGS: VASCULAR Aorta: Normal caliber aorta without aneurysm, dissection, vasculitis or significant stenosis. Scattered aortic calcifications. Celiac: Patent without evidence of aneurysm, dissection, vasculitis or significant stenosis. SMA: Patent without evidence of aneurysm,  dissection, vasculitis or significant stenosis. Renals: Both renal arteries are patent without evidence of aneurysm, dissection, vasculitis, fibromuscular dysplasia or significant stenosis. There is a small accessory right renal artery to the lower pole which also appears patent. IMA: Patent without evidence of aneurysm, dissection, vasculitis or significant stenosis. RIGHT Lower Extremity Inflow: Thrombosis demonstrated in the common iliac artery extending into the proximal external iliac artery and into the internal iliac artery. There is a small string sign of flow around the thrombus in the common iliac and external iliac arteries with no flow demonstrated in the internal iliac artery. There is collateral reconstitution of flow to the external iliac artery. Outflow: Common, superficial and profunda femoral arteries and the popliteal artery are patent without evidence of aneurysm, dissection, vasculitis or significant stenosis. Runoff: Nonocclusive filling defect in the popliteal artery probably thrombus. There is complete occlusion of the distal popliteal artery with no flow demonstrated in the tibial trunk or trifurcation vessels. 0 vessel runoff to the ankle is demonstrated. LEFT Lower Extremity Inflow: Common, internal and external iliac arteries are patent without evidence of aneurysm, dissection, vasculitis or significant stenosis. Outflow: Common, superficial and profunda femoral arteries and the popliteal artery are patent without evidence of aneurysm, dissection, vasculitis or significant stenosis. Runoff: Patent three vessel runoff to the ankle. Veins: No obvious venous abnormality within the limitations of this arterial phase study. Review of the MIP images confirms the above findings. NON-VASCULAR Lower chest: Diffuse airspace disease in the visualized lung bases, likely edema. Pneumonia would be a secondary consideration. Hepatobiliary: No focal liver abnormality is seen. Status post cholecystectomy.  No biliary dilatation. Pancreas: Unremarkable. No pancreatic ductal dilatation or surrounding inflammatory changes. Spleen: Normal in size without focal abnormality. Adrenals/Urinary Tract: Adrenal glands are unremarkable. Kidneys are normal, without renal calculi, focal lesion, or hydronephrosis. Bladder is unremarkable. Stomach/Bowel: Stomach is within normal limits. Appendix appears normal. No evidence of bowel wall thickening, distention, or inflammatory changes. Lymphatic: No significant lymphadenopathy. Reproductive: Uterus and bilateral adnexa are unremarkable. Other: No abdominal wall hernia or abnormality. No abdominopelvic ascites. Musculoskeletal: No acute or significant osseous findings. IMPRESSION: VASCULAR Occlusive filling defect, likely thrombosis in the right common iliac artery extending into the external iliac and internal iliac arteries. Collateral reconstitution of the external iliac artery. Focal nonocclusive filling defect in the right mid popliteal artery. Occlusion of the distal popliteal artery extending into the tibial trunk and trifurcation vessels with no flow demonstrated in the right calf runoff vessels. NON-VASCULAR Diffuse airspace disease in the visualized lung bases is likely due to edema. Electronically Signed   By: Burman Nieves M.D.   On: 03/17/2020 07:13   DG Chest Port 1 View  Result Date: 03/17/2020 CLINICAL DATA:  CHF EXAM: PORTABLE CHEST 1 VIEW COMPARISON:  None. FINDINGS: Diffuse interstitial prominence with patchy perihilar/bibasilar opacities. No pneumothorax or pleural effusion. Cardiomegaly. No acute osseous abnormality. IMPRESSION: Mild pulmonary edema and cardiomegaly. Electronically Signed   By: Stana Bunting M.D.   On: 03/17/2020 07:30    EKG: Independently reviewed.  Atrial fibrillation at 116 bpm  Time  spent: >45 minutes  Clydie BraunRondell A Livy Ross [facility] Pager [phone]  If 7PM-7AM, please contact night-coverage [email or web address] Password  [email or web address] 03/17/2020, 5:16 PM

## 2020-03-17 NOTE — Progress Notes (Signed)
Post op check. Looks good. Pain in foot has resolved. Dressings with some mild strikethrough. Now in NSR on amiodarone drip. Heparin continues. Brisk DS in R peroneal artery Motor / sensory function in foot intact.  Explained operative findings. Still high risk for re-thrombosis and limb loss given COVID19. Continue care plan. Will ask IM to evaluate given new onset AF, likely HF.   Rande Brunt. Lenell Antu, MD [facility] Phone Number: [phone] 03/17/2020 4:39 PM

## 2020-03-17 NOTE — ED Provider Notes (Signed)
Pt transferred from [facility].  She was found to have no dopplerable pulses in her right leg CT aortobifem done which showed an occlusive filling defect right common iliac extending into the external and internal iliac arteries.  She has an occlusion of the distal popliteal artery.  She was given a heparin bolus and started on a drip.  She also is in afib and is on an amiodarone drip.  Dr. Lenell Antu (vascular) notified that she is now here.   I called the lab to see the result of pt's Covid test.  It was positive.  The OR and Dr. Lenell Antu notified.  Pt has not been vaccinated.  She is not febrile or sob.  Patricia Atkinson was evaluated in Emergency Department on 03/17/2020 for the symptoms described in the history of present illness. She was evaluated in the context of the global COVID-19 pandemic, which necessitated consideration that the patient might be at risk for infection with the SARS-CoV-2 virus that causes COVID-19. Institutional protocols and algorithms that pertain to the evaluation of patients at risk for COVID-19 are in a state of rapid change based on information released by regulatory bodies including the CDC and federal and state organizations. These policies and algorithms were followed during the patient's care in the ED.  CRITICAL CARE Performed by: Jacalyn Lefevre   Total critical care time: 30 minutes  Critical care time was exclusive of separately billable procedures and treating other patients.  Critical care was necessary to treat or prevent imminent or life-threatening deterioration.  Critical care was time spent personally by me on the following activities: development of treatment plan with patient and/or surrogate as well as nursing, discussions with consultants, evaluation of patient's response to treatment, examination of patient, obtaining history from patient or surrogate, ordering and performing treatments and interventions, ordering and review of laboratory studies, ordering and  review of radiographic studies, pulse oximetry and re-evaluation of patient's condition.   Jacalyn Lefevre, MD 03/17/20 [phone]

## 2020-03-17 NOTE — Anesthesia Procedure Notes (Signed)
Procedure Name: Intubation Date/Time: 03/17/2020 10:36 AM Performed by: Dorthea Cove, CRNA Pre-anesthesia Checklist: Patient identified, Emergency Drugs available, Suction available and Patient being monitored Patient Re-evaluated:Patient Re-evaluated prior to induction Oxygen Delivery Method: Circle system utilized Preoxygenation: Pre-oxygenation with 100% oxygen Induction Type: IV induction Ventilation: Mask ventilation without difficulty Laryngoscope Size: Mac and 3 Grade View: Grade II Tube type: Oral Tube size: 7.0 mm Number of attempts: 1 Airway Equipment and Method: Stylet and Oral airway Placement Confirmation: ETT inserted through vocal cords under direct vision,  positive ETCO2 and breath sounds checked- equal and bilateral Secured at: 21 cm Tube secured with: Tape Dental Injury: Teeth and Oropharynx as per pre-operative assessment

## 2020-03-17 NOTE — Transfer of Care (Addendum)
Immediate Anesthesia Transfer of Care Note  Patient: Patricia Atkinson  Procedure(s) Performed: ILIO-FEMORAL AND TIBIAL THROMBECTOMY WITH FOUR COMPARTMENT FASCIOTOMY (Right Leg Upper)  Patient Location: OR (COVID +)  Anesthesia Type:General  Level of Consciousness: awake, alert  and oriented  Airway & Oxygen Therapy: Patient Spontanous Breathing and Patient connected to face mask oxygen  Post-op Assessment: Report given to RN and Post -op Vital signs reviewed and stable  Post vital signs: Reviewed and stable  Last Vitals:  Vitals Value Taken Time  BP 103/83   Temp    Pulse 74   Resp 20   SpO2 97     Last Pain:  Vitals:   03/17/20 0919  TempSrc:   PainSc: 8          Complications: No complications documented.

## 2020-03-17 NOTE — ED Provider Notes (Signed)
[facility]-EMERGENCY DEPT Provider Note   CSN: [id number] Arrival date & time: 03/17/20  0038     History Chief Complaint  Patient presents with  . Knee Pain    Patricia Atkinson is a 59 y.o. female.  Patient presents to the emergency department for evaluation of right leg pain.  Patient reports that the pain started approximately 12 hours ago.  She reports a fairly sudden onset of pain.  Pain is from the knee down to the foot.  She reports burning and numbness as well as sharp pain.  She denies any injury.        History reviewed. No pertinent past medical history.  There are no problems to display for this patient.   History reviewed. No pertinent surgical history.   OB History   No obstetric history on file.     No family history on file.  Social History   Tobacco Use  . Smoking status: Never Smoker  . Smokeless tobacco: Never Used  Substance Use Topics  . Alcohol use: Never  . Drug use: Never    Home Medications Prior to Admission medications   Medication Sig Start Date End Date Taking? Authorizing Provider  carvedilol (COREG) 12.5 MG tablet Take 12.5 mg by mouth 2 (two) times daily with a meal.   Yes [provider]  furosemide (LASIX) 40 MG tablet Take 40-80 mg by mouth See admin instructions. 80 MG in the morning and 40 MG in the evening   Yes [provider]  lisinopril (ZESTRIL) 20 MG tablet Take 20 mg by mouth daily.   Yes [provider]  simvastatin (ZOCOR) 40 MG tablet Take 40 mg by mouth daily.   Yes [provider]  spironolactone (ALDACTONE) 25 MG tablet Take 25 mg by mouth daily.   Yes [provider]    Allergies    Patient has no known allergies.  Review of Systems   Review of Systems  Musculoskeletal:       Leg pain   Neurological: Positive for numbness.  All other systems reviewed and are negative.   Physical Exam Updated Vital Signs BP (!) 163/109   Pulse 84    Temp (!) 97.4 F (36.3 C) (Oral)   Resp (!) 22   Ht 5\' 6"  (1.676 m)   Wt 87.5 kg   SpO2 94%   BMI 31.15 kg/m   Physical Exam Vitals and nursing note reviewed.  Constitutional:      General: She is not in acute distress.    Appearance: Normal appearance. She is well-developed.  HENT:     Head: Normocephalic and atraumatic.     Right Ear: Hearing normal.     Left Ear: Hearing normal.     Nose: Nose normal.  Eyes:     Conjunctiva/sclera: Conjunctivae normal.     Pupils: Pupils are equal, round, and reactive to light.  Cardiovascular:     Rate and Rhythm: Regular rhythm.     Pulses:          Dorsalis pedis pulses are 0 on the right side and detected w/ Doppler on the left side.       Posterior tibial pulses are 0 on the right side and detected w/ Doppler on the left side.     Heart sounds: S1 normal and S2 normal. No murmur heard.  No friction rub. No gallop.   Pulmonary:     Effort: Pulmonary effort is normal. No respiratory distress.  Breath sounds: Normal breath sounds.  Chest:     Chest wall: No tenderness.  Abdominal:     General: Bowel sounds are normal.     Palpations: Abdomen is soft.     Tenderness: There is no abdominal tenderness. There is no guarding or rebound. Negative signs include Murphy's sign and McBurney's sign.     Hernia: No hernia is present.  Musculoskeletal:        General: Normal range of motion.     Cervical back: Normal range of motion and neck supple.     Right lower leg: Tenderness present. No swelling. No edema.     Comments: Diffuse tenderness of right lower leg from knee down to foot.  Skin:    General: Skin is warm and dry.     Findings: No rash.  Neurological:     Mental Status: She is alert and oriented to person, place, and time.     GCS: GCS eye subscore is 4. GCS verbal subscore is 5. GCS motor subscore is 6.     Cranial Nerves: No cranial nerve deficit.     Sensory: No sensory deficit.     Coordination: Coordination normal.    Psychiatric:        Speech: Speech normal.        Behavior: Behavior normal.        Thought Content: Thought content normal.     ED Results / Procedures / Treatments   Labs (all labs ordered are listed, but only abnormal results are displayed) Labs Reviewed  CBC WITH DIFFERENTIAL/PLATELET - Abnormal; Notable for the following components:      Result Value   MCV 102.9 (*)    Platelets 409 (*)    Abs Immature Granulocytes 0.22 (*)    All other components within normal limits  BASIC METABOLIC PANEL - Abnormal; Notable for the following components:   CO2 21 (*)    Glucose, Bld 157 (*)    BUN 51 (*)    Creatinine, Ser 1.40 (*)    Calcium 8.4 (*)    GFR, Estimated 43 (*)    All other components within normal limits  LACTIC ACID, PLASMA - Abnormal; Notable for the following components:   Lactic Acid, Venous 2.7 (*)    All other components within normal limits  PROTIME-INR - Abnormal; Notable for the following components:   Prothrombin Time 15.4 (*)    INR 1.3 (*)    All other components within normal limits  D-DIMER, QUANTITATIVE (NOT AT [facility]) - Abnormal; Notable for the following components:   D-Dimer, Quant 18.06 (*)    All other components within normal limits  BRAIN NATRIURETIC PEPTIDE - Abnormal; Notable for the following components:   B Natriuretic Peptide 235.9 (*)    All other components within normal limits  RESP PANEL BY RT-PCR (FLU A&B, COVID) ARPGX2  CK  APTT    EKG EKG Interpretation  Date/Time:  Thursday March 17 2020 06:06:52 EST Ventricular Rate:  116 PR Interval:    QRS Duration: 90 QT Interval:  408 QTC Calculation: 550 R Axis:   15 Text Interpretation: Atrial fibrillation Borderline repolarization abnormality Prolonged QT interval Confirmed by Gilda CreasePollina, Anzal Bartnick J [phone]([id number]) on 03/17/2020 6:43:12 AM   Radiology CT Angio Aortobifemoral W and/or Wo Contrast  Result Date: 03/17/2020 CLINICAL DATA:  Right knee pain with right leg and foot  numbness for several hours. Elevated D-dimer. No pulses in the right leg. EXAM: CT ANGIOGRAPHY OF ABDOMINAL AORTA WITH ILIOFEMORAL  RUNOFF TECHNIQUE: Multidetector CT imaging of the abdomen, pelvis and lower extremities was performed using the standard protocol during bolus administration of intravenous contrast. Multiplanar CT image reconstructions and MIPs were obtained to evaluate the vascular anatomy. CONTRAST:  68mL OMNIPAQUE IOHEXOL 350 MG/ML SOLN COMPARISON:  None. FINDINGS: VASCULAR Aorta: Normal caliber aorta without aneurysm, dissection, vasculitis or significant stenosis. Scattered aortic calcifications. Celiac: Patent without evidence of aneurysm, dissection, vasculitis or significant stenosis. SMA: Patent without evidence of aneurysm, dissection, vasculitis or significant stenosis. Renals: Both renal arteries are patent without evidence of aneurysm, dissection, vasculitis, fibromuscular dysplasia or significant stenosis. There is a small accessory right renal artery to the lower pole which also appears patent. IMA: Patent without evidence of aneurysm, dissection, vasculitis or significant stenosis. RIGHT Lower Extremity Inflow: Thrombosis demonstrated in the common iliac artery extending into the proximal external iliac artery and into the internal iliac artery. There is a small string sign of flow around the thrombus in the common iliac and external iliac arteries with no flow demonstrated in the internal iliac artery. There is collateral reconstitution of flow to the external iliac artery. Outflow: Common, superficial and profunda femoral arteries and the popliteal artery are patent without evidence of aneurysm, dissection, vasculitis or significant stenosis. Runoff: Nonocclusive filling defect in the popliteal artery probably thrombus. There is complete occlusion of the distal popliteal artery with no flow demonstrated in the tibial trunk or trifurcation vessels. 0 vessel runoff to the ankle is  demonstrated. LEFT Lower Extremity Inflow: Common, internal and external iliac arteries are patent without evidence of aneurysm, dissection, vasculitis or significant stenosis. Outflow: Common, superficial and profunda femoral arteries and the popliteal artery are patent without evidence of aneurysm, dissection, vasculitis or significant stenosis. Runoff: Patent three vessel runoff to the ankle. Veins: No obvious venous abnormality within the limitations of this arterial phase study. Review of the MIP images confirms the above findings. NON-VASCULAR Lower chest: Diffuse airspace disease in the visualized lung bases, likely edema. Pneumonia would be a secondary consideration. Hepatobiliary: No focal liver abnormality is seen. Status post cholecystectomy. No biliary dilatation. Pancreas: Unremarkable. No pancreatic ductal dilatation or surrounding inflammatory changes. Spleen: Normal in size without focal abnormality. Adrenals/Urinary Tract: Adrenal glands are unremarkable. Kidneys are normal, without renal calculi, focal lesion, or hydronephrosis. Bladder is unremarkable. Stomach/Bowel: Stomach is within normal limits. Appendix appears normal. No evidence of bowel wall thickening, distention, or inflammatory changes. Lymphatic: No significant lymphadenopathy. Reproductive: Uterus and bilateral adnexa are unremarkable. Other: No abdominal wall hernia or abnormality. No abdominopelvic ascites. Musculoskeletal: No acute or significant osseous findings. IMPRESSION: VASCULAR Occlusive filling defect, likely thrombosis in the right common iliac artery extending into the external iliac and internal iliac arteries. Collateral reconstitution of the external iliac artery. Focal nonocclusive filling defect in the right mid popliteal artery. Occlusion of the distal popliteal artery extending into the tibial trunk and trifurcation vessels with no flow demonstrated in the right calf runoff vessels. NON-VASCULAR Diffuse airspace  disease in the visualized lung bases is likely due to edema. Electronically Signed   By: Burman Nieves M.D.   On: 03/17/2020 07:13   DG Chest Port 1 View  Result Date: 03/17/2020 CLINICAL DATA:  CHF EXAM: PORTABLE CHEST 1 VIEW COMPARISON:  None. FINDINGS: Diffuse interstitial prominence with patchy perihilar/bibasilar opacities. No pneumothorax or pleural effusion. Cardiomegaly. No acute osseous abnormality. IMPRESSION: Mild pulmonary edema and cardiomegaly. Electronically Signed   By: Stana Bunting M.D.   On: 03/17/2020 07:30    Procedures Procedures (  including critical care time)  Medications Ordered in ED Medications  sodium chloride (PF) 0.9 % injection (  Not Given 03/17/20 0534)  heparin ADULT infusion 100 units/mL (25000 units/223mL sodium chloride 0.45%) (1,100 Units/hr Intravenous New Bag/Given 03/17/20 0720)  amiodarone (NEXTERONE) 1.8 mg/mL load via infusion 150 mg (has no administration in time range)    Followed by  amiodarone (NEXTERONE PREMIX) 360-4.14 MG/200ML-% (1.8 mg/mL) IV infusion (has no administration in time range)    Followed by  amiodarone (NEXTERONE PREMIX) 360-4.14 MG/200ML-% (1.8 mg/mL) IV infusion (has no administration in time range)  iohexol (OMNIPAQUE) 350 MG/ML injection 100 mL (80 mLs Intravenous Contrast Given 03/17/20 0613)  heparin bolus via infusion 4,000 Units (4,000 Units Intravenous Bolus from Bag 03/17/20 0720)    ED Course  I have reviewed the triage vital signs and the nursing notes.  Pertinent labs & imaging results that were available during my care of the patient were reviewed by me and considered in my medical decision making (see chart for details).    MDM Rules/Calculators/A&P                          Patient presents with leg pain.  She initially checked in as knee pain but further examination and history taking reveals that she had fairly sudden onset of pain from the knee down.  Both legs are cool to the touch but I  cannot palpate pulses in either.  With bedside Doppler, however, left DP and PT were both found, I could not find either on the right.  CT angio aortobifemoral study performed. There is evidence of occlusion at the level of the right knee.  Discussed with Dr. Lenell Antu, on-call for vascular surgery.  Agrees with heparinization and request transfer to [facility] emergency department for him to evaluate her.  Patient initially was placed in a hallway bed and was not on the monitor.  Her initial heart rates were in the 70s and 80s.  While here in the department it was noted that she became tachycardic.  EKG confirms atrial fibrillation.  Unclear if she has been persistently in atrial fibrillation or in and out of it.  She did not sense the irregularity.  Patient reports no history of atrial fibrillation.  She reports a history of hypertension and congestive heart failure. CHA2DS2-VASc score is 3.  Chest x-ray shows evidence of congestive heart failure exacerbation.  We will therefore initiate rate control with amiodarone.  Heparin initiated for treatment of atrial fibrillation as well as arterial embolism.  CRITICAL CARE Performed by: Gilda Crease   Total critical care time: 40 minutes  Critical care time was exclusive of separately billable procedures and treating other patients.  Critical care was necessary to treat or prevent imminent or life-threatening deterioration.  Critical care was time spent personally by me on the following activities: development of treatment plan with patient and/or surrogate as well as nursing, discussions with consultants, evaluation of patient's response to treatment, examination of patient, obtaining history from patient or surrogate, ordering and performing treatments and interventions, ordering and review of laboratory studies, ordering and review of radiographic studies, pulse oximetry and re-evaluation of patient's condition.   Final Clinical Impression(s)  / ED Diagnoses Final diagnoses:  Atrial fibrillation with RVR (HCC)  Arterial embolism of right leg (HCC)  Acute on chronic congestive heart failure, unspecified heart failure type (HCC)    Rx / DC Orders ED Discharge Orders  None       Gilda Crease, MD 03/17/20 [phone]

## 2020-03-17 NOTE — Anesthesia Preprocedure Evaluation (Addendum)
Anesthesia Evaluation  Patient identified by MRN, date of birth, ID band Patient awake    Reviewed: Allergy & Precautions, NPO status , Patient's Chart, lab work & pertinent test results  History of Anesthesia Complications Negative for: history of anesthetic complications  Airway Mallampati: III  TM Distance: >3 FB Neck ROM: Full    Dental  (+) Dental Advisory Given   Pulmonary Recent URI ,  Covid-19 Nucleic Acid Test Results Lab Results      Component                Value               Date                      SARSCOV2NAA              POSITIVE (A)        03/17/2020              breath sounds clear to auscultation       Cardiovascular hypertension, Pt. on medications and Pt. on home beta blockers + Peripheral Vascular Disease and +CHF   Rhythm:Regular     Neuro/Psych negative neurological ROS  negative psych ROS   GI/Hepatic negative GI ROS, Neg liver ROS,   Endo/Other    Renal/GU negative Renal ROS     Musculoskeletal negative musculoskeletal ROS (+)   Abdominal   Peds  Hematology negative hematology ROS (+)   Anesthesia Other Findings   Reproductive/Obstetrics                            Anesthesia Physical Anesthesia Plan  ASA: III and emergent  Anesthesia Plan: General   Post-op Pain Management:    Induction: Intravenous, Rapid sequence and Cricoid pressure planned  PONV Risk Score and Plan: 3  Airway Management Planned: Oral ETT  Additional Equipment: None  Intra-op Plan:   Post-operative Plan: Extubation in OR  Informed Consent: I have reviewed the patients History and Physical, chart, labs and discussed the procedure including the risks, benefits and alternatives for the proposed anesthesia with the patient or authorized representative who has indicated his/her understanding and acceptance.     Dental advisory given  Plan Discussed with: CRNA and  Surgeon  Anesthesia Plan Comments:        Anesthesia Quick Evaluation

## 2020-03-17 NOTE — ED Notes (Signed)
Called [facility] for transport, Bernette Mayers, MD is accepting and [facility] Charge has been notified of transfer.

## 2020-03-17 NOTE — Progress Notes (Signed)
ANTICOAGULATION CONSULT NOTE  Pharmacy Consult for Heparin Indication: atrial fibrillation  No Known Allergies  Patient Measurements: Height: 5\' 6"  (167.6 cm) Weight: 90.1 kg (198 lb 10.2 oz) IBW/kg (Calculated) : 59.3 HEPARIN DW (KG): 78.9  Vital Signs: Temp: 97.7 F (36.5 C) (11/25 1548) Temp Source: Axillary (11/25 1548) BP: 127/67 (11/25 1548) Pulse Rate: 72 (11/25 1548)  Labs: Recent Labs    03/17/20 0311 03/17/20 1550  HGB 15.0  --   HCT 45.6  --   PLT 409*  --   APTT 35  --   LABPROT 15.4*  --   INR 1.3*  --   HEPARINUNFRC  --  1.44*  CREATININE 1.40*  --   CKTOTAL 41  --     Estimated Creatinine Clearance: 47.7 mL/min (A) (by C-G formula based on SCr of 1.4 mg/dL (H)).   Assessment: 20 YOF presented with RLE acute limb ischemia.  MD suspects likely embolus from new diagnosis of AFib.  Pharmacy consulted to manage IV heparin.  Patient is s/p thrombectomy and fasciotomies today 03/17/20.  Heparin was never turned off per RN.  Heparin level is supra-therapeutic at 1.44 units/mL this afternoon - she received heparin irrigation in the OR and would not expect the irrigation to alter heparin level to this degree.  Lab drawn on the opposite arm as the heparin infusion and no bleeding observed per RN.  Goal of Therapy:  Heparin level 0.3-0.7 units/ml Monitor platelets by anticoagulation protocol: Yes   Plan:  Hold heparin for 1 hr (done around 1710) At 1815, restart IV heparin at 800 units/hr Check 6 hr heparin level after resumption Monitor for bleeding  Dalesha Stanback D. 05-30-1984, PharmD, BCPS, BCCCP 03/17/2020, 5:12 PM

## 2020-03-17 NOTE — Op Note (Addendum)
DATE OF SERVICE: 03/17/2020  PATIENT:  Patricia Atkinson  59 y.o. female  PRE-OPERATIVE DIAGNOSIS: Acute right lower extremity ischemia  POST-OPERATIVE DIAGNOSIS: Acute on chronic right lower extremity ischemia  PROCEDURE:   1.  Iliofemoral thrombectomy via common femoral artery exposure 2.  Femoral-popliteal thrombectomy the common femoral artery exposure 3.  Anterior tibial and Peroneal thrombectomy via below-knee popliteal exposure 4.  4 compartment calf fasciotomies right lower extremity  SURGEON:  Surgeon(s) and Role:    * Leonie Douglas, MD - Primary  ASSISTANT: Lianne Cure, PA-C  An assistant was required to facilitate exposure and expedite the case.  ANESTHESIA:   general  EBL:  BLOOD ADMINISTERED:none  DRAINS: none   LOCAL MEDICATIONS USED:  NONE  SPECIMEN:  none  COUNTS: confirmed correct.  TOURNIQUET:  * No tourniquets in log *  PATIENT DISPOSITION:  PACU - hemodynamically stable.   Delay start of Pharmacological VTE agent (>24hrs) due to surgical blood loss or risk of bleeding: no  INDICATION FOR PROCEDURE: Celes Dedic is a 59 y.o. female with acute right lower extremity ischemia. CTA performed in the ER shows an occlusive iliac thrombus and no opacification of the tibial vessels below the knee. After careful discussion of risks, benefits, and alternatives the patient was offered thrombectomy. We specifically discussed risk of limb loss. The patient understood and wished to proceed.  OPERATIVE FINDINGS:  Iliofemoral embolectomy - chronic organized thrombus. Torrential inflow after embolectomy. Femoro-popliteal thrombectomy - mixed acute/chronic organized thrombus with return of backbleeding. Absent doppler flow at the ankle despite negative passes distally from a femoral exposure. Exposed the below knee popliteal artery and the tibial trifurcation. No pulse beyond the below knee popliteal artery. Tibial vessels identified and encircled. PT  diminutive and would not accept a fogarty even when transected. #3 fogarty passed down the AT. Multiple passes returned chronic appearing thrombus. #3 fogarty passed down the peroneal. Multiple passes returned mixed acute and chronic thrombus. Strong DS at origin of AT / Peroneal. Upon completion: no AT DS at the ankle; monophasic peroneal DS.  High risk for limb loss given acute limb thrombosis and COVID19 infection.  DESCRIPTION OF PROCEDURE: After identification of the patient in the pre-operative holding area, the patient was transferred to the operating room. The patient was positioned supine on the operating room table. Anesthesia was induced. The right leg was prepped and draped in standard fashion. A surgical pause was performed confirming correct patient, procedure, and operative location.  An oblique incision was made over the course of the right common femoral artery.  This incision was carried down through subcutaneous tissue until femoral sheath was encountered.  This was incised sharply.  The common femoral artery, superficial femoral artery, and multiple profunda femoris arteries were encircled with Silastic Vesseloops.    Patient was systemically heparinized.  The femoral arteries were clamped.  A transverse arteriotomy was made in the common femoral artery immediately proximal to the bifurcation.  Clamps were removed in sequence and adequate backbleeding was noted from the profunda femoris arteries.  Mild backbleeding was noted from the superficial femoral artery.  No inflow was noted.  A #4 Fogarty embolectomy balloon catheter was advanced proximally into the iliac arteries.  Thrombectomy was performed until no clot returned on passage of the catheter.  Torrential inflow was achieved.  A #4 Fogarty embolectomy balloon catheter was advanced distally into the femoropopliteal arteries.  Thrombectomy was performed until no clot returned on passage of the catheter.  Moderate  backbleeding was  achieved.  Doppler machine was brought onto the field and used to interrogate the vessels at the foot and ankle.  No Doppler flow was noted.  The decision was made to proceed with the tibial thrombectomy via below-knee exposure.  A medial incision was made in the proximal right calf.  Incision was carried through the fascia of the leg.  Great care was taken to avoid injuring the saphenous vein.  Fascia was entered.  The gastric anemias was swept posteriorly.  The popliteal neurovascular bundle was identified in the fat pad.  The soleus was taken down.  A diminutive posterior tibial artery and vein were identified and encircled.  Exposure was carried down on the popliteal artery until we identified the left of the anterior tibial artery.  The exposure was carried further onto the peroneal artery.   The below-knee popliteal and tibioperoneal trunk were encircled with Silastic Vesseloops and clamped.  A anterior medial arteriotomy was made in transverse fashion with an 11 blade.  Multiple passes of a #3 Fogarty embolectomy balloon catheter were made down the anterior tibial artery.  Chronic appearing gelatinous thrombus returned.  Embolectomy was performed until no thrombus returned.  Similarly multiple passes of #3 Fogarty embolectomy balloon catheter were made down the peroneal artery.  Mixed acute and chronic appearing thrombus returned.  Embolectomy was performed till no thrombus returned.  Posterior tibial artery was diminutive and did not permit a #3 Fogarty embolectomy balloon catheter or a #2 coronary dilator even when transected.   The below-knee popliteal artery arteriotomy was closed using interrupted 7-0 Prolene suture.  Triphasic Doppler flow was heard at the origin of the anterior tibial and the peroneal artery.  Weakly monophasic Doppler flow was heard in the peroneal artery at the ankle.  No Doppler flow was noted in the anterior tibial artery at the ankle.  4  compartment fasciotomies were performed.  The existing medial calf incision had already opened the superficial and deep posterior compartments and so further fasciotomy was not needed.  An anterior lateral incision in the calf was made and carried down until the septum between the anterior and lateral compartments was identified.  Fasciotomies were made on the anterior lateral compartments and extended to the ankle in both the anterior and oral compartments.  The fasciotomies were carefully extended proximally to avoid injury to the superficial peroneal nerve.  The muscle was interrogated with Bovie electrocautery and found to fasciculate.  A moderate amount of bleeding was noted in the divided tissues.  The calf incisions were closed loosely with staples. Groin incision was closed in layers using 2-0 Vicryl, 3-0 Vicryl, 4-0 Monocryl. Bandages were applied.  Upon completion of the case instrument and sharps counts were confirmed correct. The patient was transferred to the PACU in good condition. I was present for all portions of the procedure.  Rande Brunt. Lenell Antu, MD [facility] Phone Number: [phone] 03/17/2020 1:35 PM

## 2020-03-18 ENCOUNTER — Encounter (HOSPITAL_COMMUNITY): Payer: Self-pay | Admitting: Vascular Surgery

## 2020-03-18 ENCOUNTER — Inpatient Hospital Stay (HOSPITAL_COMMUNITY): Payer: Medicare Other

## 2020-03-18 DIAGNOSIS — I8290 Acute embolism and thrombosis of unspecified vein: Secondary | ICD-10-CM

## 2020-03-18 DIAGNOSIS — I340 Nonrheumatic mitral (valve) insufficiency: Secondary | ICD-10-CM

## 2020-03-18 DIAGNOSIS — I361 Nonrheumatic tricuspid (valve) insufficiency: Secondary | ICD-10-CM

## 2020-03-18 DIAGNOSIS — I4891 Unspecified atrial fibrillation: Secondary | ICD-10-CM

## 2020-03-18 DIAGNOSIS — I480 Paroxysmal atrial fibrillation: Secondary | ICD-10-CM | POA: Diagnosis not present

## 2020-03-18 LAB — URINALYSIS, ROUTINE W REFLEX MICROSCOPIC
Bacteria, UA: NONE SEEN
Bilirubin Urine: NEGATIVE
Glucose, UA: 50 mg/dL — AB
Ketones, ur: NEGATIVE mg/dL
Leukocytes,Ua: NEGATIVE
Nitrite: NEGATIVE
Protein, ur: 100 mg/dL — AB
Specific Gravity, Urine: 1.01 (ref 1.005–1.030)
pH: 5 (ref 5.0–8.0)

## 2020-03-18 LAB — HEPARIN LEVEL (UNFRACTIONATED)
Heparin Unfractionated: 0.41 IU/mL (ref 0.30–0.70)
Heparin Unfractionated: <0.1 IU/mL — ABNORMAL LOW (ref 0.30–0.70)
Heparin Unfractionated: 0.19 IU/mL — ABNORMAL LOW (ref 0.30–0.70)
Heparin Unfractionated: 0.48 IU/mL (ref 0.30–0.70)

## 2020-03-18 LAB — COMPREHENSIVE METABOLIC PANEL
ALT: 19 U/L (ref 0–44)
AST: 32 U/L (ref 15–41)
Albumin: 2 g/dL — ABNORMAL LOW (ref 3.5–5.0)
Alkaline Phosphatase: 183 U/L — ABNORMAL HIGH (ref 38–126)
Anion gap: 12 (ref 5–15)
BUN: 31 mg/dL — ABNORMAL HIGH (ref 8–23)
CO2: 20 mmol/L — ABNORMAL LOW (ref 22–32)
Calcium: 7.5 mg/dL — ABNORMAL LOW (ref 8.9–10.3)
Chloride: 106 mmol/L (ref 98–111)
Creatinine, Ser: 1.03 mg/dL — ABNORMAL HIGH (ref 0.44–1.00)
GFR, Estimated: >60 mL/min (ref >60)
Glucose, Bld: 197 mg/dL — ABNORMAL HIGH (ref 70–99)
Potassium: 4.1 mmol/L (ref 3.5–5.1)
Sodium: 138 mmol/L (ref 135–145)
Total Bilirubin: 1.2 mg/dL (ref 0.3–1.2)
Total Protein: 6.3 g/dL — ABNORMAL LOW (ref 6.5–8.1)

## 2020-03-18 LAB — ECHOCARDIOGRAM COMPLETE
Area-P 1/2: 6.53 cm2
Height: 66 in
MV M vel: 4.44 m/s
MV Peak grad: 78.9 mmHg
Radius: 0.8 cm
S' Lateral: 3.81 cm
Single Plane A2C EF: 40.9 %
Single Plane A4C EF: 48.2 %
Weight: 3178.15 oz

## 2020-03-18 LAB — CBC
HCT: 32.6 % — ABNORMAL LOW (ref 36.0–46.0)
HCT: 30.8 % — ABNORMAL LOW (ref 36.0–46.0)
Hemoglobin: 10.6 g/dL — ABNORMAL LOW (ref 12.0–15.0)
Hemoglobin: 9.7 g/dL — ABNORMAL LOW (ref 12.0–15.0)
MCH: 34.2 pg — ABNORMAL HIGH (ref 26.0–34.0)
MCH: 33.7 pg (ref 26.0–34.0)
MCHC: 32.5 g/dL (ref 30.0–36.0)
MCHC: 31.5 g/dL (ref 30.0–36.0)
MCV: 105.2 fL — ABNORMAL HIGH (ref 80.0–100.0)
MCV: 106.9 fL — ABNORMAL HIGH (ref 80.0–100.0)
Platelets: 297 10*3/uL (ref 150–400)
Platelets: 253 10*3/uL (ref 150–400)
RBC: 3.1 MIL/uL — ABNORMAL LOW (ref 3.87–5.11)
RBC: 2.88 MIL/uL — ABNORMAL LOW (ref 3.87–5.11)
RDW: 12.3 % (ref 11.5–15.5)
RDW: 12.3 % (ref 11.5–15.5)
WBC: 8.1 10*3/uL (ref 4.0–10.5)
WBC: 7.4 10*3/uL (ref 4.0–10.5)
nRBC: 0 % (ref 0.0–0.2)
nRBC: 0 % (ref 0.0–0.2)

## 2020-03-18 LAB — HEMOGLOBIN A1C
Hgb A1c MFr Bld: 5.9 % — ABNORMAL HIGH (ref 4.8–5.6)
Hgb A1c MFr Bld: 6 % — ABNORMAL HIGH (ref 4.8–5.6)
Mean Plasma Glucose: 122.63 mg/dL
Mean Plasma Glucose: 125.5 mg/dL

## 2020-03-18 LAB — PHOSPHORUS: Phosphorus: 3.6 mg/dL (ref 2.5–4.6)

## 2020-03-18 LAB — LIPID PANEL
Cholesterol: 168 mg/dL (ref 0–200)
HDL: 17 mg/dL — ABNORMAL LOW (ref >40)
LDL Cholesterol: 121 mg/dL — ABNORMAL HIGH (ref 0–99)
Total CHOL/HDL Ratio: 9.9 RATIO
Triglycerides: 149 mg/dL (ref <150)
VLDL: 30 mg/dL (ref 0–40)

## 2020-03-18 LAB — D-DIMER, QUANTITATIVE: D-Dimer, Quant: 9.31 ug/mL-FEU — ABNORMAL HIGH (ref 0.00–0.50)

## 2020-03-18 LAB — GLUCOSE, CAPILLARY
Glucose-Capillary: 171 mg/dL — ABNORMAL HIGH (ref 70–99)
Glucose-Capillary: 216 mg/dL — ABNORMAL HIGH (ref 70–99)
Glucose-Capillary: 196 mg/dL — ABNORMAL HIGH (ref 70–99)
Glucose-Capillary: 167 mg/dL — ABNORMAL HIGH (ref 70–99)
Glucose-Capillary: 194 mg/dL — ABNORMAL HIGH (ref 70–99)

## 2020-03-18 LAB — C-REACTIVE PROTEIN: CRP: 14.6 mg/dL — ABNORMAL HIGH (ref <1.0)

## 2020-03-18 LAB — FERRITIN: Ferritin: 2236 ng/mL — ABNORMAL HIGH (ref 11–307)

## 2020-03-18 LAB — MAGNESIUM: Magnesium: 2.2 mg/dL (ref 1.7–2.4)

## 2020-03-18 LAB — POCT ACTIVATED CLOTTING TIME: Activated Clotting Time: 213 seconds

## 2020-03-18 MED ORDER — FUROSEMIDE 10 MG/ML IJ SOLN
40.0000 mg | Freq: Once | INTRAMUSCULAR | Status: AC
  Administered 2020-03-18: 40 mg via INTRAVENOUS
  Filled 2020-03-18: qty 4

## 2020-03-18 NOTE — Progress Notes (Signed)
ANTICOAGULATION CONSULT NOTE - Follow Up Consult  Pharmacy Consult for Heparin Indication: Afib and acute LE ischemia  No Known Allergies  Patient Measurements: Height: 5\' 6"  (167.6 cm) Weight: 90.1 kg (198 lb 10.2 oz) IBW/kg (Calculated) : 59.3 Heparin Dosing Weight:   Vital Signs: Temp: 98.2 F (36.8 C) (11/26 1147) Temp Source: Oral (11/26 1147) BP: 107/72 (11/26 1147) Pulse Rate: 62 (11/26 1147)  Labs: Recent Labs    03/17/20 0311 03/17/20 1550 03/18/20 0122 03/18/20 0128 03/18/20 0954 03/18/20 1223  HGB 15.0  --  10.6*  --  9.7*  --   HCT 45.6  --  32.6*  --  30.8*  --   PLT 409*  --  297  --  253  --   APTT 35  --   --   --   --   --   LABPROT 15.4*  --   --   --   --   --   INR 1.3*  --   --   --   --   --   HEPARINUNFRC  --    < >  --  0.41 <0.10* 0.19*  CREATININE 1.40*  --  1.03*  --   --   --   CKTOTAL 41  --   --   --   --   --    < > = values in this interval not displayed.    Estimated Creatinine Clearance: 64.8 mL/min (A) (by C-G formula based on SCr of 1.03 mg/dL (H)).  Assessment:  Anticoag: Heparin for new Afib. Acute right lower extremity ischemia from afib vs COVID clotting. Ddimer 18>9.31. Hep level 1.44>0.41. Hgb 15>10.6 (EBL 03/20/20 in OR 11/25). Plts 12/25. CHA2DS2-VASc score =5. - Confirmatory heparin level 0.19 now slightly low (RN reports some possibly IV occlusion)  - 11/25: s/p OR for fem-pop thrombectomy, Anterior tibial and Peroneal thrombectomy and 4 compartment calf fasciotomies right lower extremity. Heparin was never off per RN (had heparin irrigation in the OR). Still high risk for re-thrombosis and limb loss given COVID19  Goal of Therapy:  Heparin level 0.3-0.7 units/ml Monitor platelets by anticoagulation protocol: Yes   Plan:  IV heparin increase to 1000 units/hr and recheck in 6 hrs. Daily HL and CBC   Adalynd Donahoe S. 12/25, PharmD, BCPS Clinical Staff Pharmacist [email or web address] Merilynn Finland, Oliverio Cho  [place] 03/18/2020,1:09 PM

## 2020-03-18 NOTE — Progress Notes (Signed)
PHARMACIST LIPID MONITORING   Patricia Atkinson is a 59 y.o. female admitted on 03/17/2020 with acute right lower extremity ischemia.  Pharmacy has been consulted to optimize lipid-lowering therapy with the indication of secondary prevention for clinical ASCVD.  Recent Labs:  Lipid Panel (last 6 months):   Lab Results  Component Value Date   CHOL 168 03/18/2020   TRIG 149 03/18/2020   HDL 17 (L) 03/18/2020   CHOLHDL 9.9 03/18/2020   VLDL 30 03/18/2020   LDLCALC 121 (H) 03/18/2020    Hepatic function panel (last 6 months):   Lab Results  Component Value Date   AST 32 03/18/2020   ALT 19 03/18/2020   ALKPHOS 183 (H) 03/18/2020   BILITOT 1.2 03/18/2020    SCr (since admission):   Serum creatinine: 1.03 mg/dL (H) 20/35/59 7416 Estimated creatinine clearance: 64.8 mL/min (A)  Current lipid-lowering therapy: Zocor 40mg /d Previous lipid-lowering therapies (if applicable):  Documented or reported allergies or intolerances to lipid-lowering therapies (if applicable): None  Assessment:  No change in current acute inflammatory state with +COVID 19 and acute right lower extremity ischemia from Afib +/-COVID clots.  Recommendation per protocol:  Continue current lipid-lowering therapy.  Follow-up with:  Primary care provider - PCP  Follow-up labs after discharge:    Liver function panel and lipid panel in 8-12 weeks then annually  Plan: Con't current therapy Reassess lipid therapy and goals after COVID 19 recovery.   Jilda Kress S. 10-12, PharmD, BCPS Clinical Staff Pharmacist [email or web address]  Merilynn Finland, PharmD 03/18/2020, 7:30 AM

## 2020-03-18 NOTE — Progress Notes (Signed)
CCMD notified that aflutter converted to NSR at this time. Continuing to monitor.

## 2020-03-18 NOTE — Progress Notes (Addendum)
ANTICOAGULATION CONSULT NOTE - Follow Up Consult  Pharmacy Consult for Heparin Indication: Afib and acute LE ischemia  Anticoag: Heparin for new Afib. Acute right lower extremity ischemia from afib vs COVID clotting. Ddimer 18>9.31. Hep level 1.44>0.41. Hgb 15>10.6 (EBL in OR 11/25). Plts [id number]. CHA2DS2-VASc score =5.  - 11/25: s/p OR for fem-pop thrombectomy, Anterior tibial and Peroneal thrombectomy and 4 compartment calf fasciotomies right lower extremity. Heparin was never off per RN (had heparin irrigation in the OR). Still high risk for re-thrombosis and limb loss given COVID19 11/26:  Heparin level 0.48 units/ml  Goal of Therapy:  Heparin level 0.3-0.7 units/ml Monitor platelets by anticoagulation protocol: Yes   Plan:  Continue heparin at 1000 units/hr Daily HL and CBC  Thanks for allowing pharmacy to be a part of this patient's care.  Talbert Cage, PharmD Clinical Pharmacist  Addum:  Am heparin level 0.92 units/ml.  Will decrease drip slightly to 900 units/hr

## 2020-03-18 NOTE — Progress Notes (Signed)
   ASSESSMENT & PLAN:  Patricia Atkinson is a 59 y.o. female s/p RLE iliofemoral, femoralpopliteal, and tibial thrombectomy. COVID19 infection. AF RVR.   PRN pain control PT / OT / OOB during daylight hours / Ambulate as able Diet as tolerated Continue therapeutic anticoagulation for AF / acute limb ischemia. Transition to DOAC tomorrow. Appreciate IM / Cards assistance with AF and COVID.  SUBJECTIVE:  Foot pain resolved. Normal motor / sensory function in right foot.  OBJECTIVE:  BP 107/72 (BP Location: Left Arm)   Pulse 62   Temp 98.2 F (36.8 C) (Oral)   Resp (!) 24   Ht 5\' 6"  (1.676 m)   Wt 90.1 kg   SpO2 98%   BMI 32.06 kg/m   Intake/Output Summary (Last 24 hours) at 03/18/2020 1343 Last data filed at 03/18/2020 0500 Gross per 24 hour  Intake 2226.46 ml  Output 675 ml  Net 1551.46 ml    Constitutional: well appearing. no acute distress. Cardiac: irregular. Vascular: Biphasic DS in peroneal and AT. Groin and medial calf dressings clean and dry. Lateral calf dressing with strikethrough. Pulmonary: unlabored on [place]. Abdominal: soft  CBC Latest Ref Rng & Units 03/18/2020 03/18/2020 03/17/2020  WBC 4.0 - 10.5 K/uL 7.4 8.1 8.3  Hemoglobin 12.0 - 15.0 g/dL 03/19/2020) 10.6(L) 15.0  Hematocrit 36 - 46 % 30.8(L) 32.6(L) 45.6  Platelets 150 - 400 K/uL 253 297 409(H)     CMP Latest Ref Rng & Units 03/18/2020 03/17/2020  Glucose 70 - 99 mg/dL 03/19/2020) 132(G)  BUN 8 - 23 mg/dL 401(U) 27(O)  Creatinine 0.44 - 1.00 mg/dL 53(G) 6.44(I)  Sodium 135 - 145 mmol/L 138 135  Potassium 3.5 - 5.1 mmol/L 4.1 3.7  Chloride 98 - 111 mmol/L 106 101  CO2 22 - 32 mmol/L 20(L) 21(L)  Calcium 8.9 - 10.3 mg/dL 7.5(L) 8.4(L)  Total Protein 6.5 - 8.1 g/dL 6.3(L) -  Total Bilirubin 0.3 - 1.2 mg/dL 1.2 -  Alkaline Phos 38 - 126 U/L 183(H) -  AST 15 - 41 U/L 32 -  ALT 0 - 44 U/L 19 -    Estimated Creatinine Clearance: 64.8 mL/min (A) (by C-G formula based on SCr of 1.03 mg/dL (H)).  3.47(Q.  Rande Brunt, MD [facility] Phone Number: [phone] 03/18/2020 1:43 PM

## 2020-03-18 NOTE — Progress Notes (Signed)
ANTICOAGULATION CONSULT NOTE - Follow Up Consult  Pharmacy Consult for heparin Indication: DVT  Labs: Recent Labs    03/17/20 0311 03/17/20 1550 03/18/20 0122 03/18/20 0128  HGB 15.0  --  10.6*  --   HCT 45.6  --  32.6*  --   PLT 409*  --  297  --   APTT 35  --   --   --   LABPROT 15.4*  --   --   --   INR 1.3*  --   --   --   HEPARINUNFRC  --  1.44*  --  0.41  CREATININE 1.40*  --  1.03*  --   CKTOTAL 41  --   --   --     Assessment/Plan:  59yo female therapeutic on heparin after rate change.  Noted Hgb 15 > 10.6; RN notes no signs of bleeding.  Will continue gtt at current rate and confirm stable with additional level.   Vernard Gambles, PharmD, BCPS  03/18/2020,4:34 AM

## 2020-03-18 NOTE — Progress Notes (Signed)
Patient has only urinated once despite receiving IV fluids and 40 lasix X 1 today. Sent a message to hospitalist and got the approval to preform in and out. Output from in and out was 550 cc. UA and culture sent. Will continue to monitor patient.

## 2020-03-18 NOTE — Evaluation (Addendum)
Occupational Therapy Evaluation Patient Details Name: Patricia Atkinson MRN: [id number] DOB: [birth year] Today's Date: 03/18/2020    History of Present Illness 59 y.o. female present to ED 03/17/20 for evaluation of sudden onset right leg pain.  CT aortobifem done which showed an occlusive filling defect right common iliac extending into the external and internal iliac arteries. New afib; +COVID (unvaccinated); 11/25 multiple thrombectomies RLE plus 4 compartment fasciotomies   Clinical Impression   PTA pt living with family and functioning at independent community level. She reports moving here from [place] in March to help care for family. At time of eval, pt was able to complete bed mobility with supervision assist and sit <> stands with min A/min guard and RW. Pt then completed functional mobility from bed to bathroom for toilet transfer. BSC was placed over top toilet to improve independence- with this in place pt can complete transfer at min guard level. She then completed standing posterior peri care with min guard assist for balance. Noted cognitive deficits with problem solving throughout session. This was specifically noted when sequencing and initiating multi step tasks. SpO2 remained stable on 3L [place] throughout session. Given current status, recommend HHOT to support safety, BADL engagement, and independent PLOF. Pt may progress without needing follow up, will continue to assess. OT will continue to follow per POC listed below.   Follow Up Recommendations  Home health OT;Supervision - Intermittent (may progress without)    Equipment Recommendations  3 in 1 bedside commode    Recommendations for Other Services       Precautions / Restrictions Precautions Precautions: Fall Restrictions Weight Bearing Restrictions: No      Mobility Bed Mobility Overal bed mobility: Needs Assistance Bed Mobility: Supine to Sit     Supine to sit: Supervision     General bed mobility comments:  increased time and cues for sequencing    Transfers Overall transfer level: Needs assistance Equipment used: Rolling walker (2 wheeled) Transfers: Sit to/from Stand Sit to Stand: Min assist;Min guard         General transfer comment: initial min A to power up into standing; pt then progressing to min guard after sit <> stand from toilet. Cues needed for hand placement and sequencing    Balance Overall balance assessment: Needs assistance Sitting-balance support: Feet supported;Bilateral upper extremity supported Sitting balance-Leahy Scale: Good     Standing balance support: Bilateral upper extremity supported;During functional activity Standing balance-Leahy Scale: Poor Standing balance comment: reliant on external support of RW or countertop at sink this date                           ADL either performed or assessed with clinical judgement   ADL Overall ADL's : Needs assistance/impaired Eating/Feeding: Set up;Sitting   Grooming: Min guard;Standing Grooming Details (indicate cue type and reason): standing at sink to wash hands after using bathing. Pt requiring sequencing cues to problem solve Upper Body Bathing: Set up;Sitting   Lower Body Bathing: Minimal assistance;Sit to/from stand;Sitting/lateral leans;Moderate assistance   Upper Body Dressing : Set up;Sitting   Lower Body Dressing: Minimal assistance;Moderate assistance;Sit to/from stand;Sitting/lateral leans   Toilet Transfer: Min guard;Ambulation;RW;Regular Toilet;Grab bars Toilet Transfer Details (indicate cue type and reason): step bt step sequencing cues given to walk from bed to toilet with RW. Toileting- Architect and Hygiene: Min guard;Sit to/from stand Toileting - Clothing Manipulation Details (indicate cue type and reason): pt in partial stand/squatting to complete posterior peri  care after BM. OT closely guarding due to poor dynamic balance. Pt able to complete the hygiene then hand  clothes to OT with one hand supported on grab bar     Functional mobility during ADLs: Min guard;Rolling walker;Cueing for sequencing;Cueing for safety       Vision Patient Visual Report: No change from baseline       Perception     Praxis      Pertinent Vitals/Pain Pain Assessment: 0-10 Pain Score: 4  Pain Location: RLE Pain Descriptors / Indicators: Tender;Aching Pain Intervention(s): Limited activity within patient's tolerance;Monitored during session;Repositioned     Hand Dominance     Extremity/Trunk Assessment Upper Extremity Assessment Upper Extremity Assessment: Overall WFL for tasks assessed   Lower Extremity Assessment Lower Extremity Assessment: Defer to PT evaluation;Generalized weakness   Cervical / Trunk Assessment Cervical / Trunk Assessment: Normal   Communication Communication Communication: No difficulties   Cognition Arousal/Alertness: Awake/alert Behavior During Therapy: WFL for tasks assessed/performed Overall Cognitive Status: Impaired/Different from baseline Area of Impairment: Problem solving                             Problem Solving: Slow processing;Decreased initiation;Difficulty sequencing;Requires verbal cues General Comments: noted overall decreased initiation and difficulty sequencing tasks. Often stops after one step of an ADL task and asks "what do I do next"   General Comments       Exercises     Shoulder Instructions      Home Living Family/patient expects to be discharged to:: Private residence Living Arrangements: Parent Available Help at Discharge: Family Type of Home: Apartment Home Access: Stairs to enter [employer] of Steps: 16 (second floor) Entrance Stairs-Rails: Right;Left Home Layout: One level     Bathroom Shower/Tub: Tub/shower unit;Curtain   Bathroom Toilet: Standard Bathroom Accessibility: Yes   Home Equipment: None   Additional Comments: been in [place] caring for family  member since March (grandmother passed 11/18)      Prior Functioning/Environment Level of Independence: Independent                 OT Problem List: Decreased strength;Decreased knowledge of use of DME or AE;Decreased activity tolerance;Cardiopulmonary status limiting activity;Impaired balance (sitting and/or standing);Decreased safety awareness;Pain      OT Treatment/Interventions: Self-care/ADL training;Patient/family education;Therapeutic exercise;Balance training;Energy conservation;Therapeutic activities;DME and/or AE instruction    OT Goals(Current goals can be found in the care plan section) Acute Rehab OT Goals Patient Stated Goal: return to independence OT Goal Formulation: With patient Time For Goal Achievement: 04/01/20 Potential to Achieve Goals: Good  OT Frequency: Min 3X/week   Barriers to D/C:            Co-evaluation              AM-PAC OT "6 Clicks" Daily Activity     Outcome Measure Help from another person eating meals?: A Little Help from another person taking care of personal grooming?: A Little Help from another person toileting, which includes using toliet, bedpan, or urinal?: A Little Help from another person bathing (including washing, rinsing, drying)?: A Lot Help from another person to put on and taking off regular upper body clothing?: A Little Help from another person to put on and taking off regular lower body clothing?: A Lot 6 Click Score: 16   End of Session Equipment Utilized During Treatment: Gait belt;Rolling walker Nurse Communication: Mobility status  Activity Tolerance: Patient tolerated treatment well Patient left:  in chair;with call bell/phone within reach  OT Visit Diagnosis: Unsteadiness on feet (R26.81);Other abnormalities of gait and mobility (R26.89);Muscle weakness (generalized) (M62.81);Pain Pain - Right/Left: Right Pain - part of body: Leg                Time: 1445-1525 OT Time Calculation (min): 40  min Charges:  OT General Charges $OT Visit: 1 Visit OT Evaluation $OT Eval Moderate Complexity: 1 Mod OT Treatments $Self Care/Home Management : 8-22 mins  Dalphine Handing, MSOT, OTR/L Acute Rehabilitation Services [facility] Office Number: [phone] Pager: [phone]  Dalphine Handing 03/18/2020, 5:06 PM

## 2020-03-18 NOTE — Progress Notes (Signed)
  Echocardiogram 2D Echocardiogram has been performed.  Patricia Atkinson Patricia Atkinson 03/18/2020, 11:46 AM

## 2020-03-18 NOTE — Evaluation (Signed)
hysical Therapy Evaluation Patient Details Name: Patricia Atkinson MRN: [id number] DOB: 15-May-1960 Today's Date: 03/18/2020   History of Present Illness  59 y.o. female present to ED 03/17/20 for evaluation of sudden onset right leg pain.  CT aortobifem done which showed an occlusive filling defect right common iliac extending into the external and internal iliac arteries. New afib; +COVID (unvaccinated); 11/25 multiple thrombectomies RLE plus 4 compartment fasciotomies  Clinical Impression   Patient is s/p above surgery resulting in functional limitations due to the deficits listed below (see PT Problem List). Patient was independent PTA. She was overall min assist during mobility and ambulation on 3L with VSS. She unfortunately does have 16 steps to get up to her second floor apartment.  Patient will benefit from skilled PT to increase their independence and safety with mobility to allow discharge to the venue listed below.       Follow Up Recommendations Home health PT;Supervision - Intermittent    Equipment Recommendations  Rolling walker with 5" wheels    Recommendations for Other Services       Precautions / Restrictions Precautions Precautions: Fall Restrictions Weight Bearing Restrictions: No      Mobility  Bed Mobility Overal bed mobility: Needs Assistance Bed Mobility: Supine to Sit     Supine to sit: Supervision     General bed mobility comments: increased time and cues for sequencing    Transfers Overall transfer level: Needs assistance Equipment used: Rolling walker (2 wheeled) Transfers: Sit to/from Stand Sit to Stand: Min assist;Min guard         General transfer comment: initial min A to power up into standing; pt then progressing to min guard after sit <> stand from toilet. Cues needed for hand placement and sequencing  Ambulation/Gait Ambulation/Gait assistance: Min assist;+2 safety/equipment Gait Distance (Feet): 20 Feet (seated, 25) Assistive  device: Rolling walker (2 wheeled) Gait Pattern/deviations: Step-to pattern;Decreased stride length Gait velocity: very slow   General Gait Details: slow, steady; vc for sequencing, including in reverse  Stairs            Wheelchair Mobility    Modified Rankin (Stroke Patients Only)       Balance Overall balance assessment: Needs assistance Sitting-balance support: Feet supported;Bilateral upper extremity supported Sitting balance-Leahy Scale: Good     Standing balance support: Bilateral upper extremity supported;During functional activity Standing balance-Leahy Scale: Poor Standing balance comment: reliant on external support of RW or countertop at sink this date                             Pertinent Vitals/Pain Pain Assessment: 0-10 Pain Score: 4  Pain Location: RLE Pain Descriptors / Indicators: Tender;Aching Pain Intervention(s): Limited activity within patient's tolerance;Monitored during session;Repositioned    Home Living Family/patient expects to be discharged to:: Private residence Living Arrangements: Parent Available Help at Discharge: Family Type of Home: Apartment Home Access: Stairs to enter Entrance Stairs-Rails: Doctor, general practice of Steps: 16 (second floor) Home Layout: One level Home Equipment: None Additional Comments: been in [place] caring for family member since March (grandmother passed 11/18)    Prior Function Level of Independence: Independent               Hand Dominance        Extremity/Trunk Assessment   Upper Extremity Assessment Upper Extremity Assessment: Overall WFL for tasks assessed    Lower Extremity Assessment Lower Extremity Assessment: Defer to PT evaluation;Generalized weakness RLE  Deficits / Details: multiple dressings s/p 4 compartment fasciotomies; able to flex to 90 degrees, able to lift against gravity RLE Sensation:  (reports has sensation)    Cervical / Trunk  Assessment Cervical / Trunk Assessment: Normal  Communication   Communication: No difficulties  Cognition Arousal/Alertness: Awake/alert Behavior During Therapy: WFL for tasks assessed/performed Overall Cognitive Status: Impaired/Different from baseline Area of Impairment: Problem solving                             Problem Solving: Slow processing;Decreased initiation;Difficulty sequencing;Requires verbal cues General Comments: noted overall decreased initiation and difficulty sequencing tasks. Often stops after one step of an ADL task and asks "what do I do next"      General Comments      Exercises Other Exercises Other Exercises: educated on importance of ankle pumps and general AROM   Assessment/Plan    PT Assessment Patient needs continued PT services  PT Problem List Decreased range of motion;Decreased activity tolerance;Decreased balance;Decreased mobility;Decreased cognition;Decreased knowledge of use of DME;Pain       PT Treatment Interventions Gait training;DME instruction;Stair training;Functional mobility training;Therapeutic activities;Therapeutic exercise;Patient/family education    PT Goals (Current goals can be found in the Care Plan section)  Acute Rehab PT Goals Patient Stated Goal: return to independence PT Goal Formulation: With patient Time For Goal Achievement: 04/01/20 Potential to Achieve Goals: Good    Frequency Min 3X/week   Barriers to discharge Inaccessible home environment 16 steps to enter    Co-evaluation               AM-PAC PT "6 Clicks" Mobility  Outcome Measure Help needed turning from your back to your side while in a flat bed without using bedrails?: None Help needed moving from lying on your back to sitting on the side of a flat bed without using bedrails?: A Little Help needed moving to and from a bed to a chair (including a wheelchair)?: A Little Help needed standing up from a chair using your arms (e.g.,  wheelchair or bedside chair)?: A Little Help needed to walk in hospital room?: A Little Help needed climbing 3-5 steps with a railing? : A Little 6 Click Score: 19    End of Session Equipment Utilized During Treatment: Gait belt;Oxygen Activity Tolerance: Patient tolerated treatment well Patient left: in chair;with call bell/phone within reach;Other (comment) (RN agreed chair alarm not indicated;) Nurse Communication: Mobility status PT Visit Diagnosis: Pain;Difficulty in walking, not elsewhere classified (R26.2) Pain - Right/Left: Right Pain - part of body: Leg    Time: 7341-9379 PT Time Calculation (min) (ACUTE ONLY): 43 min   Charges:   PT Evaluation $PT Eval Moderate Complexity: 1 Mod           Jerolyn Center, PT Pager [phone]   Zena Amos 03/18/2020, 5:29 PM

## 2020-03-18 NOTE — Progress Notes (Signed)
PROGRESS NOTE    Patricia Atkinson  ZHY:[id number] DOB: [birth year] DOA: 03/17/2020 PCP: Patient, No Pcp Per   Brief Narrative:  59 y.o. female with medical history significant of hypertension hyperlipidemia who presents with complaints of right leg pain which started yesterday afternoon.  Pain was described as sharp and severe.  Denied having any injury to onset of pain.  Associated symptoms include mild cough, mild short of breath, and diarrhea which started attending a family member's funeral 1 week ago.  Patient notes that she was not vaccinated against COVID-19.  Denies having any significant vomiting or change in smell/taste.  She had gone to the emergency department and was evaluated and found to be in atrial fibrillation.  She was found to have no dopplerable pulses of the right leg and CT revealed an occlusive filling defect of the right common iliac extending into the external and internal iliac arteries and occlusion of the distal popliteal artery.  She was given heparin bolus and started on heparin drip.  Vascular surgery admitted the patient for open thrombectomy. Chest x-ray was concerning for mild pulmonary edema and cardiomegaly. COVID-19 screening was positive.  Labs revealed WBC 8.3, hemoglobin 15, platelets 409, BUN 51, creatinine 1.4, lactic acid 2.7, and D-dimer 18.06.  Assessment & Plan:   Principal Problem:   Thrombus Active Problems:   Atrial fibrillation (HCC)   COVID-19 virus infection   Acute CHF (congestive heart failure) (HCC)   Essential hypertension   #1 acute hypoxic respiratory failure likely multifactorial from Covid pneumonia versus acute CHF versus could she have a PE.  Patient admitted with shortness of breath and cough.  She was found to be COVID-19 positive.  She was not on oxygen prior to admission to hospital.  Now she is dependent on 3 L of oxygen to maintain sats above 90%. Her BNP was 235.9. Her D-dimer is 18.06.  I will order vascular Doppler of the  lower extremities to rule out DVT.  Patient has acute filling defect in the right common iliac arteries.  Once her kidney function improves will consider getting a CT of the chest.  Anyhow she has been started on heparin for A. fib and arterial clot.  #2 acute combined CHF exacerbation she is positive by 3 L overnight we will give her another dose of Lasix now.  She received 1 dose of Lasix 40 mg on admission.  Will need to monitor renal functions closely.  Echo with ejection fraction 45 to 50%.  Left ventricle demonstrates global hypokinesis.  Grade 3 diastolic dysfunction.  #3 new onset atrial fibrillation CHA2DS2-VASc score is 5 on amiodarone drip and heparin.  Check TSH in a.m.  #4 Covid positive with chest x-ray infiltrates versus fluid overload with elevated D-dimer.  Continue remdesivir steroids and supportive treatment.  Encourage incentive spirometry flutter valve out of bed as much as tolerated  #5 possible AKI versus CKD unknown baseline  #6 history of essential hypertension continue beta-blocker  #5 right common iliac artery acute thrombus on heparin drip  Estimated body mass index is 32.06 kg/m as calculated from the following:   Height as of this encounter: 5\' 6"  (1.676 m).   Weight as of this encounter: 90.1 kg.  DVT prophylaxis: Heparin  code Status: Full code Family Communication: None at bedside  Subjective: She is resting in bed has good appetite about 8 breakfast feels breathing is better no new complaints  Objective: Vitals:   03/17/20 2353 03/18/20 0357 03/18/20 0736 03/18/20 1147  BP:  116/70 124/72 107/72  Pulse: 70 63 74 62  Resp: (!) 25 17 (!) 25 (!) 24  Temp:  98.4 F (36.9 C) 98.7 F (37.1 C) 98.2 F (36.8 C)  TempSrc:  Oral Oral Oral  SpO2: 94% 95% 98% 98%  Weight:      Height:        Intake/Output Summary (Last 24 hours) at 03/18/2020 1330 Last data filed at 03/18/2020 0500 Gross per 24 hour  Intake 2226.46 ml  Output 675 ml  Net 1551.46 ml    Filed Weights   03/17/20 0047 03/17/20 0602 03/17/20 1548  Weight: 87.5 kg 87.5 kg 90.1 kg    Examination:  General exam: Appears calm and comfortable  Respiratory system: Clear to auscultation. Respiratory effort normal. Cardiovascular system: S1 & S2 heard, RRR. No JVD, murmurs, rubs, gallops or clicks. No pedal edema. Gastrointestinal system: Abdomen is nondistended, soft and nontender. No organomegaly or masses felt. Normal bowel sounds heard. Central nervous system: Alert and oriented. No focal neurological deficits. Extremities: Trace bilateral edema. Skin: No rashes, lesions or ulcers Psychiatry: Judgement and insight appear normal. Mood & affect appropriate.     Data Reviewed: I have personally reviewed following labs and imaging studies  CBC: Recent Labs  Lab 03/17/20 0311 03/18/20 0122 03/18/20 0954  WBC 8.3 8.1 7.4  NEUTROABS 6.5  --   --   HGB 15.0 10.6* 9.7*  HCT 45.6 32.6* 30.8*  MCV 102.9* 105.2* 106.9*  PLT 409* 297 253   Basic Metabolic Panel: Recent Labs  Lab 03/17/20 0311 03/18/20 0122  NA 135 138  K 3.7 4.1  CL 101 106  CO2 21* 20*  GLUCOSE 157* 197*  BUN 51* 31*  CREATININE 1.40* 1.03*  CALCIUM 8.4* 7.5*  MG  --  2.2  PHOS  --  3.6   GFR: Estimated Creatinine Clearance: 64.8 mL/min (A) (by C-G formula based on SCr of 1.03 mg/dL (H)). Liver Function Tests: Recent Labs  Lab 03/18/20 0122  AST 32  ALT 19  ALKPHOS 183*  BILITOT 1.2  PROT 6.3*  ALBUMIN 2.0*   No results for input(s): LIPASE, AMYLASE in the last 168 hours. No results for input(s): AMMONIA in the last 168 hours. Coagulation Profile: Recent Labs  Lab 03/17/20 0311  INR 1.3*   Cardiac Enzymes: Recent Labs  Lab 03/17/20 0311  CKTOTAL 41   BNP (last 3 results) No results for input(s): PROBNP in the last 8760 hours. HbA1C: Recent Labs    03/18/20 0122 03/18/20 0954  HGBA1C 5.9* 6.0*   CBG: Recent Labs  Lab 03/17/20 2356 03/18/20 0804 03/18/20 1152  03/18/20 1314  GLUCAP 175* 171* 216* 196*   Lipid Profile: Recent Labs    03/18/20 0122  CHOL 168  HDL 17*  LDLCALC 121*  TRIG 149  CHOLHDL 9.9   Thyroid Function Tests: No results for input(s): TSH, T4TOTAL, FREET4, T3FREE, THYROIDAB in the last 72 hours. Anemia Panel: Recent Labs    03/18/20 0122  FERRITIN 2,236*   Sepsis Labs: Recent Labs  Lab 03/17/20 0311 03/17/20 1856  PROCALCITON  --  0.54  LATICACIDVEN 2.7*  --     Recent Results (from the past 240 hour(s))  Resp Panel by RT-PCR (Flu A&B, Covid) Nasopharyngeal Swab     Status: Abnormal   Collection Time: 03/17/20  7:12 AM   Specimen: Nasopharyngeal Swab; Nasopharyngeal(NP) swabs in vial transport medium  Result Value Ref Range Status   SARS Coronavirus 2 by RT PCR POSITIVE (A)  NEGATIVE Final    Comment: RESULT CALLED TO, READ BACK BY AND VERIFIED WITH: HAVILAND,J DR @0857  ON 03/17/20 JACKSON,K (NOTE) SARS-CoV-2 target nucleic acids are DETECTED.  The SARS-CoV-2 RNA is generally detectable in upper respiratory specimens during the acute phase of infection. Positive results are indicative of the presence of the identified virus, but do not rule out bacterial infection or co-infection with other pathogens not detected by the test. Clinical correlation with patient history and other diagnostic information is necessary to determine patient infection status. The expected result is Negative.  Fact Sheet for Patients: 03/19/20  Fact Sheet for Healthcare Providers: BloggerCourse.com  This test is not yet approved or cleared by the [employer] FDA and  has been authorized for detection and/or diagnosis of SARS-CoV-2 by FDA under an Emergency Use Authorization (EUA).  This EUA will remain in effect (meaning this tes t can be used) for the duration of  the COVID-19 declaration under Section 564(b)(1) of the Act, 21 U.S.C. section 360bbb-3(b)(1), unless  the authorization is terminated or revoked sooner.     Influenza A by PCR NEGATIVE NEGATIVE Final   Influenza B by PCR NEGATIVE NEGATIVE Final    Comment: (NOTE) The Xpert Xpress SARS-CoV-2/FLU/RSV plus assay is intended as an aid in the diagnosis of influenza from Nasopharyngeal swab specimens and should not be used as a sole basis for treatment. Nasal washings and aspirates are unacceptable for Xpert Xpress SARS-CoV-2/FLU/RSV testing.  Fact Sheet for Patients: [place]  Fact Sheet for Healthcare Providers: BloggerCourse.com  This test is not yet approved or cleared by the [employer] FDA and has been authorized for detection and/or diagnosis of SARS-CoV-2 by FDA under an Emergency Use Authorization (EUA). This EUA will remain in effect (meaning this test can be used) for the duration of the COVID-19 declaration under Section 564(b)(1) of the Act, 21 U.S.C. section 360bbb-3(b)(1), unless the authorization is terminated or revoked.  Performed at [facility], [address] [place], [place]          Radiology Studies: CT Angio Aortobifemoral W and/or Wo Contrast  Result Date: 03/17/2020 CLINICAL DATA:  Right knee pain with right leg and foot numbness for several hours. Elevated D-dimer. No pulses in the right leg. EXAM: CT ANGIOGRAPHY OF ABDOMINAL AORTA WITH ILIOFEMORAL RUNOFF TECHNIQUE: Multidetector CT imaging of the abdomen, pelvis and lower extremities was performed using the standard protocol during bolus administration of intravenous contrast. Multiplanar CT image reconstructions and MIPs were obtained to evaluate the vascular anatomy. CONTRAST:  88mL OMNIPAQUE IOHEXOL 350 MG/ML SOLN COMPARISON:  None. FINDINGS: VASCULAR Aorta: Normal caliber aorta without aneurysm, dissection, vasculitis or significant stenosis. Scattered aortic calcifications. Celiac: Patent without evidence of  aneurysm, dissection, vasculitis or significant stenosis. SMA: Patent without evidence of aneurysm, dissection, vasculitis or significant stenosis. Renals: Both renal arteries are patent without evidence of aneurysm, dissection, vasculitis, fibromuscular dysplasia or significant stenosis. There is a small accessory right renal artery to the lower pole which also appears patent. IMA: Patent without evidence of aneurysm, dissection, vasculitis or significant stenosis. RIGHT Lower Extremity Inflow: Thrombosis demonstrated in the common iliac artery extending into the proximal external iliac artery and into the internal iliac artery. There is a small string sign of flow around the thrombus in the common iliac and external iliac arteries with no flow demonstrated in the internal iliac artery. There is collateral reconstitution of flow to the external iliac artery. Outflow: Common, superficial and profunda femoral arteries and the popliteal  artery are patent without evidence of aneurysm, dissection, vasculitis or significant stenosis. Runoff: Nonocclusive filling defect in the popliteal artery probably thrombus. There is complete occlusion of the distal popliteal artery with no flow demonstrated in the tibial trunk or trifurcation vessels. 0 vessel runoff to the ankle is demonstrated. LEFT Lower Extremity Inflow: Common, internal and external iliac arteries are patent without evidence of aneurysm, dissection, vasculitis or significant stenosis. Outflow: Common, superficial and profunda femoral arteries and the popliteal artery are patent without evidence of aneurysm, dissection, vasculitis or significant stenosis. Runoff: Patent three vessel runoff to the ankle. Veins: No obvious venous abnormality within the limitations of this arterial phase study. Review of the MIP images confirms the above findings. NON-VASCULAR Lower chest: Diffuse airspace disease in the visualized lung bases, likely edema. Pneumonia would be a  secondary consideration. Hepatobiliary: No focal liver abnormality is seen. Status post cholecystectomy. No biliary dilatation. Pancreas: Unremarkable. No pancreatic ductal dilatation or surrounding inflammatory changes. Spleen: Normal in size without focal abnormality. Adrenals/Urinary Tract: Adrenal glands are unremarkable. Kidneys are normal, without renal calculi, focal lesion, or hydronephrosis. Bladder is unremarkable. Stomach/Bowel: Stomach is within normal limits. Appendix appears normal. No evidence of bowel wall thickening, distention, or inflammatory changes. Lymphatic: No significant lymphadenopathy. Reproductive: Uterus and bilateral adnexa are unremarkable. Other: No abdominal wall hernia or abnormality. No abdominopelvic ascites. Musculoskeletal: No acute or significant osseous findings. IMPRESSION: VASCULAR Occlusive filling defect, likely thrombosis in the right common iliac artery extending into the external iliac and internal iliac arteries. Collateral reconstitution of the external iliac artery. Focal nonocclusive filling defect in the right mid popliteal artery. Occlusion of the distal popliteal artery extending into the tibial trunk and trifurcation vessels with no flow demonstrated in the right calf runoff vessels. NON-VASCULAR Diffuse airspace disease in the visualized lung bases is likely due to edema. Electronically Signed   By: Burman Nieves M.D.   On: 03/17/2020 07:13   DG Chest Port 1 View  Result Date: 03/17/2020 CLINICAL DATA:  CHF EXAM: PORTABLE CHEST 1 VIEW COMPARISON:  None. FINDINGS: Diffuse interstitial prominence with patchy perihilar/bibasilar opacities. No pneumothorax or pleural effusion. Cardiomegaly. No acute osseous abnormality. IMPRESSION: Mild pulmonary edema and cardiomegaly. Electronically Signed   By: Stana Bunting M.D.   On: 03/17/2020 07:30   ECHOCARDIOGRAM COMPLETE  Result Date: 03/18/2020    ECHOCARDIOGRAM REPORT   Patient Name:   Patricia Atkinson  Date of Exam: 03/18/2020 Medical Rec #:  [id number]       Height:       66.0 in Accession #:    [id number]      Weight:       198.6 lb Date of Birth:  04-28-1960       BSA:          1.994 m Patient Age:    59 years        BP:           103/63 mmHg Patient Gender: F               HR:           60 bpm. Exam Location:  Inpatient Procedure: 2D Echo, Cardiac Doppler and Color Doppler Indications:    I48.0 Paroxysmal atrial fibrillation  History:        Patient has no prior history of Echocardiogram examinations.                 COVID-19 Positive.  Sonographer:    Advertising copywriter Referring  Phys: [id number] RONDELL A SMITH IMPRESSIONS  1. Left ventricular ejection fraction, by estimation, is 45 to 50%. The left ventricle has mildly decreased function. The left ventricle demonstrates global hypokinesis. The left ventricular internal cavity size was mildly dilated. There is mild concentric left ventricular hypertrophy. Left ventricular diastolic parameters are consistent with Grade III diastolic dysfunction (restrictive). Elevated left atrial pressure.  2. Right ventricular systolic function is mildly reduced. The right ventricular size is normal. There is mildly elevated pulmonary artery systolic pressure. The estimated right ventricular systolic pressure is 38.3 mmHg.  3. Left atrial size was severely dilated.  4. There is leaflet malcoaptation. Effective regurgitant orifica area is 0.4 cm, regurgitant volume 54 mL, regurgitant fraction 60%. The mitral valve appears structurally to be grossly normal. Severe mitral valve regurgitation. No evidence of mitral stenosis.  5. Tricuspid valve regurgitation is moderate.  6. The aortic valve is tricuspid. Aortic valve regurgitation is not visualized. No aortic stenosis is present.  7. The inferior vena cava is normal in size with greater than 50% respiratory variability, suggesting right atrial pressure of 3 mmHg. Conclusion(s)/Recommendation(s): Consider TEE to better evaluate the  mechanism and severity of mitral insufficiency. FINDINGS  Left Ventricle: Left ventricular ejection fraction, by estimation, is 45 to 50%. The left ventricle has mildly decreased function. The left ventricle demonstrates global hypokinesis. The left ventricular internal cavity size was mildly dilated. There is  mild concentric left ventricular hypertrophy. Left ventricular diastolic parameters are consistent with Grade III diastolic dysfunction (restrictive). Elevated left atrial pressure. Right Ventricle: The right ventricular size is normal. Right vetricular wall thickness was not well visualized. Right ventricular systolic function is mildly reduced. There is mildly elevated pulmonary artery systolic pressure. The tricuspid regurgitant velocity is 2.97 m/s, and with an assumed right atrial pressure of 3 mmHg, the estimated right ventricular systolic pressure is 38.3 mmHg. Left Atrium: Left atrial size was severely dilated. Right Atrium: Right atrial size was normal in size. Pericardium: There is no evidence of pericardial effusion. Mitral Valve: There is leaflet malcoaptation. Effective regurgitant orifice area is 0.4 cm, regurgitant volume 54 mL, regurgitant fraction 60%. The mitral valve is grossly normal. Mild mitral annular calcification. Severe mitral valve regurgitation, with centrally-directed jet. No evidence of mitral valve stenosis. Pulmonary venous flow shows systolic flow reversal. Tricuspid Valve: The tricuspid valve is normal in structure. Tricuspid valve regurgitation is moderate. Aortic Valve: The aortic valve is tricuspid. Aortic valve regurgitation is not visualized. No aortic stenosis is present. Pulmonic Valve: The pulmonic valve was normal in structure. Pulmonic valve regurgitation is trivial. Aorta: The aortic root and ascending aorta are structurally normal, with no evidence of dilitation. Venous: The inferior vena cava is normal in size with greater than 50% respiratory variability,  suggesting right atrial pressure of 3 mmHg. IAS/Shunts: No atrial level shunt detected by color flow Doppler.  LEFT VENTRICLE PLAX 2D LVIDd:         5.35 cm      Diastology LVIDs:         3.81 cm      LV e' medial:    4.79 cm/s LV PW:         1.44 cm      LV E/e' medial:  26.1 LV IVS:        1.13 cm      LV e' lateral:   6.85 cm/s LVOT diam:     2.10 cm      LV E/e' lateral: 18.2 LV SV:  36 LV SV Index:   18 LVOT Area:     3.46 cm  LV Volumes (MOD) LV vol d, MOD A2C: 85.5 ml LV vol d, MOD A4C: 110.6 ml LV vol s, MOD A2C: 50.5 ml LV vol s, MOD A4C: 57.3 ml LV SV MOD A2C:     35.0 ml LV SV MOD A4C:     53.3 ml RIGHT VENTRICLE            IVC RV Basal diam:  3.26 cm    IVC diam: 1.10 cm RV S prime:     6.53 cm/s TAPSE (M-mode): 1.9 cm LEFT ATRIUM             Index       RIGHT ATRIUM           Index LA diam:        5.00 cm 2.51 cm/m  RA Area:     18.90 cm LA Vol (A2C):   74.0 ml 37.11 ml/m RA Volume:   51.30 ml  25.73 ml/m LA Vol (A4C):   93.8 ml 47.04 ml/m LA Biplane Vol: 86.6 ml 43.43 ml/m  AORTIC VALVE LVOT Vmax:   59.80 cm/s LVOT Vmean:  35.700 cm/s LVOT VTI:    0.104 m  AORTA Ao Root diam: 3.10 cm Ao Asc diam:  2.70 cm MITRAL VALVE                 TRICUSPID VALVE MV Area (PHT): 6.53 cm      TR Peak grad:   35.3 mmHg MV Decel Time: 116 msec      TR Vmax:        297.00 cm/s MR Peak grad:    78.9 mmHg MR Mean grad:    56.0 mmHg   SHUNTS MR Vmax:         444.00 cm/s Systemic VTI:  0.10 m MR Vmean:        354.0 cm/s  Systemic Diam: 2.10 cm MR PISA:         4.02 cm MR PISA Eff ROA: 36 mm MR PISA Radius:  0.80 cm MV E velocity: 125.00 cm/s MV A velocity: 40.60 cm/s MV E/A ratio:  3.08 Mihai Croitoru MD Electronically signed by Thurmon FairMihai Croitoru MD Signature Date/Time: 03/18/2020/12:40:24 PM    Final         Scheduled Meds: . albuterol  2 puff Inhalation Q6H  . vitamin C  500 mg Oral Daily  . aspirin EC  81 mg Oral Q0600  . carvedilol  12.5 mg Oral BID WC  . Chlorhexidine Gluconate Cloth  6 each  Topical Daily  . docusate sodium  100 mg Oral Daily  . insulin aspart  0-5 Units Subcutaneous QHS  . insulin aspart  0-9 Units Subcutaneous TID WC  . methylPREDNISolone (SOLU-MEDROL) injection  0.5 mg/kg Intravenous Q12H   Followed by  . [START ON 03/20/2020] predniSONE  50 mg Oral Daily  . pantoprazole  40 mg Oral Daily  . simvastatin  40 mg Oral q1800  . zinc sulfate  220 mg Oral Daily   Continuous Infusions: . sodium chloride    . sodium chloride 100 mL/hr at 03/18/20 0639  . amiodarone 30 mg/hr (03/18/20 0741)  . heparin 1,000 Units/hr (03/18/20 1312)  . magnesium sulfate bolus IVPB    . remdesivir 100 mg in NS 100 mL 100 mg (03/18/20 0807)     LOS: 1 day   Alwyn RenElizabeth G Kolyn Rozario, MD 03/18/2020, 1:30 PM

## 2020-03-18 NOTE — Consult Note (Addendum)
Cardiology Consultation:   Patient ID: Patricia Atkinson MRN: [id number]; DOB: 05/15/1960  Admit date: 03/17/2020 Date of Consult: 03/18/2020  Primary Care Provider: Patient, No Pcp Per [facility] Cardiologist:  New to [facility], Dr. Darral Atkinson [facility] Electrophysiologist:  None    Patient Profile:   Patricia Atkinson is a 59 y.o. female with a hx of CHF, hypertension, and hyperlipidemia who is being seen today for the evaluation of afib and CHF at the request of Dr. Lenell Atkinson.  History of Present Illness:   Patricia Atkinson is a 59 year old female with past medical history of CHF, hypertension, and hyperlipidemia.  Patient reportedly was diagnosed with heart failure in 2017 in [place].  She was admitted in an hospital near either [place] or [place].  She reportedly had a cardiac catheterization at that time that did not show significant coronary artery disease.  She does not know her ejection fraction and does not remember echocardiogram.  She has not seen her cardiologist over several years therefore does not remember his name either nor the name of the hospital.  Since 2017, she has not had any heart failure issues or symptoms.  She was not aware of any history of atrial fibrillation or atrial flutter.  She is aware that she had a leaky valve however was never told how severe it is.  2 weeks ago, she attended a funeral and has been having some shortness of breath since.  She presented to [facility] on 03/17/2020 with 12-hour onset of right lower extremity pain.  ED physician was unable to feel pulses on the right side.  CT angiogram demonstrated occlusive filling defect likely thrombosis in the right common iliac artery extending into the external iliac and internal reviewed echo arteries, focal nonocclusive filling defect in the right middle popliteal artery, occlusive defect in the distal popliteal artery extending into the tibial trunk and bifurcation vessels with no flow  demonstrated in the right calf runoff vessels.  EKG demonstrated new onset of atrial flutter/fibrillation.  IV amiodarone started in the ED with conversion to sinus rhythm.  Symptoms concerning for embolic ischemic limb secondary to new onset of atrial fibrillation.  Initial chest x-ray was also concerning for mild heart failure symptoms.  Patient was given a dose of IV Lasix.  Vascular surgery was consulted and the patient subsequently underwent iliofemoral lobectomy, femoral-popliteal thrombectomy, anterior tibial and peroneal thrombectomy, 4 compartment fasciotomy in the right lower extremity.  Echocardiogram obtained on 05/19/2019 showed EF 45 to 50%, grade 3 DD, RVSP 38.3 mmHg, mitral valve demonstrated malcoaptation with severe MR, moderate TR.  TEE was recommended.  Patient is currently maintaining sinus rhythm.  Cardiology service has been consulted for atrial fibrillation and heart failure.  Covid test positive.   Past Medical History:  Diagnosis Date  . Heart failure (HCC)    diagnosed around 2017 in [place]    Past Surgical History:  Procedure Laterality Date  . THROMBECTOMY OF BYPASS GRAFT FEMORAL- TIBIAL ARTERY Right 03/17/2020   Procedure: ILIO-FEMORAL AND TIBIAL THROMBECTOMY WITH FOUR COMPARTMENT FASCIOTOMY;  Surgeon: Patricia Douglas, MD;  Location: [facility] OR;  Service: Vascular;  Laterality: Right;     Home Medications:  Prior to Admission medications   Medication Sig Start Date End Date Taking? Authorizing Provider  carvedilol (COREG) 12.5 MG tablet Take 12.5 mg by mouth 2 (two) times daily with a meal.   Yes [provider]  furosemide (LASIX) 40 MG tablet Take 40-80 mg by mouth See admin instructions. 80 MG  in the morning and 40 MG in the evening   Yes [provider]  lisinopril (ZESTRIL) 20 MG tablet Take 20 mg by mouth daily.   Yes [provider]  simvastatin (ZOCOR) 40 MG tablet Take 40 mg by mouth daily.   Yes [provider]    spironolactone (ALDACTONE) 25 MG tablet Take 25 mg by mouth daily.   Yes [provider]    Inpatient Medications: Scheduled Meds: . albuterol  2 puff Inhalation Q6H  . vitamin C  500 mg Oral Daily  . aspirin EC  81 mg Oral Q0600  . carvedilol  12.5 mg Oral BID WC  . Chlorhexidine Gluconate Cloth  6 each Topical Daily  . docusate sodium  100 mg Oral Daily  . furosemide  40 mg Intravenous Once  . insulin aspart  0-5 Units Subcutaneous QHS  . insulin aspart  0-9 Units Subcutaneous TID WC  . methylPREDNISolone (SOLU-MEDROL) injection  0.5 mg/kg Intravenous Q12H   Followed by  . [START ON 03/20/2020] predniSONE  50 mg Oral Daily  . pantoprazole  40 mg Oral Daily  . simvastatin  40 mg Oral q1800  . zinc sulfate  220 mg Oral Daily   Continuous Infusions: . sodium chloride    . sodium chloride 100 mL/hr at 03/18/20 0639  . amiodarone 30 mg/hr (03/18/20 0741)  . heparin 1,000 Units/hr (03/18/20 1312)  . magnesium sulfate bolus IVPB    . remdesivir 100 mg in NS 100 mL 100 mg (03/18/20 0807)   PRN Meds: sodium chloride, acetaminophen **OR** acetaminophen, alum & mag hydroxide-simeth, bisacodyl, chlorpheniramine-HYDROcodone, guaiFENesin-dextromethorphan, hydrALAZINE, HYDROmorphone (DILAUDID) injection, labetalol, magnesium sulfate bolus IVPB, metoprolol tartrate, ondansetron, oxyCODONE, phenol, potassium chloride, senna-docusate, sodium phosphate  Allergies:   No Known Allergies  Social History:   Social History   Socioeconomic History  . Marital status: Widowed    Spouse name: Not on file  . Number of children: Not on file  . Years of education: Not on file  . Highest education level: Not on file  Occupational History  . Not on file  Tobacco Use  . Smoking status: Never Smoker  . Smokeless tobacco: Never Used  Substance and Sexual Activity  . Alcohol use: Yes    Comment: socially  . Drug use: Never  . Sexual activity: Not on file  Other Topics Concern  . Not on  file  Social History Narrative  . Not on file   Social Determinants of Health   Financial Resource Strain:   . Difficulty of Paying Living Expenses: Not on file  Food Insecurity:   . Worried About Programme researcher, broadcasting/film/video in the Last Year: Not on file  . Ran Out of Food in the Last Year: Not on file  Transportation Needs:   . Lack of Transportation (Medical): Not on file  . Lack of Transportation (Non-Medical): Not on file  Physical Activity:   . Days of Exercise per Week: Not on file  . Minutes of Exercise per Session: Not on file  Stress:   . Feeling of Stress : Not on file  Social Connections:   . Frequency of Communication with Friends and Family: Not on file  . Frequency of Social Gatherings with Friends and Family: Not on file  . Attends Religious Services: Not on file  . Active Member of Clubs or Organizations: Not on file  . Attends Banker Meetings: Not on file  . Marital Status: Not on file  Intimate Partner  Violence:   . Fear of Current or Ex-Partner: Not on file  . Emotionally Abused: Not on file  . Physically Abused: Not on file  . Sexually Abused: Not on file    Family History:    Family History  Problem Relation Age of Onset  . Heart attack Father   . Heart attack Paternal Grandmother      ROS:  Please see the history of present illness.   All other ROS reviewed and negative.     Physical Exam/Data:   Vitals:   03/17/20 2353 03/18/20 0357 03/18/20 0736 03/18/20 1147  BP:  116/70 124/72 107/72  Pulse: 70 63 74 62  Resp: (!) 25 17 (!) 25 (!) 24  Temp:  98.4 F (36.9 C) 98.7 F (37.1 C) 98.2 F (36.8 C)  TempSrc:  Oral Oral Oral  SpO2: 94% 95% 98% 98%  Weight:      Height:        Intake/Output Summary (Last 24 hours) at 03/18/2020 1446 Last data filed at 03/18/2020 0500 Gross per 24 hour  Intake 2226.46 ml  Output 675 ml  Net 1551.46 ml   Last 3 Weights 03/17/2020 03/17/2020 03/17/2020  Weight (lbs) 198 lb 10.2 oz 193 lb 193 lb   Weight (kg) 90.1 kg 87.544 kg 87.544 kg     Body mass index is 32.06 kg/m.  General:  Well nourished, well developed, in no acute distress HEENT: normal Lymph: no adenopathy Neck: no JVD Endocrine:  No thryomegaly Vascular: No carotid bruits; FA pulses 2+ bilaterally without bruits  Cardiac:  normal S1, S2; RRR; no murmur  Lungs:  clear to auscultation bilaterally, no wheezing, rhonchi or rales  Abd: soft, nontender, no hepatomegaly  Ext: no edema Musculoskeletal:  No deformities, BUE and BLE strength normal and equal Skin: warm and dry  Neuro:  CNs 2-12 intact, no focal abnormalities noted Psych:  Normal affect   EKG:  The EKG was personally reviewed and demonstrates: Atrial fibrillation versus atrial flutter Telemetry:  Telemetry was personally reviewed and demonstrates: Sinus rhythm with baseline heart rate in the 60s.  Relevant CV Studies:  Echo 03/18/2020 1. Left ventricular ejection fraction, by estimation, is 45 to 50%. The  left ventricle has mildly decreased function. The left ventricle  demonstrates global hypokinesis. The left ventricular internal cavity size  was mildly dilated. There is mild  concentric left ventricular hypertrophy. Left ventricular diastolic  parameters are consistent with Grade III diastolic dysfunction  (restrictive). Elevated left atrial pressure.  2. Right ventricular systolic function is mildly reduced. The right  ventricular size is normal. There is mildly elevated pulmonary artery  systolic pressure. The estimated right ventricular systolic pressure is  38.3 mmHg.  3. Left atrial size was severely dilated.  4. There is leaflet malcoaptation. Effective regurgitant orifica area is  0.4 cm, regurgitant volume 54 mL, regurgitant fraction 60%. The mitral  valve appears structurally to be grossly normal. Severe mitral valve  regurgitation. No evidence of mitral  stenosis.  5. Tricuspid valve regurgitation is moderate.  6. The aortic  valve is tricuspid. Aortic valve regurgitation is not  visualized. No aortic stenosis is present.  7. The inferior vena cava is normal in size with greater than 50%  respiratory variability, suggesting right atrial pressure of 3 mmHg.   Laboratory Data:  High Sensitivity Troponin:  No results for input(s): TROPONINIHS in the last 720 hours.   Chemistry Recent Labs  Lab 03/17/20 0311 03/18/20 0122  NA 135 138  K 3.7 4.1  CL 101 106  CO2 21* 20*  GLUCOSE 157* 197*  BUN 51* 31*  CREATININE 1.40* 1.03*  CALCIUM 8.4* 7.5*  GFRNONAA 43* >60  ANIONGAP 13 12    Recent Labs  Lab 03/18/20 0122  PROT 6.3*  ALBUMIN 2.0*  AST 32  ALT 19  ALKPHOS 183*  BILITOT 1.2   Hematology Recent Labs  Lab 03/17/20 0311 03/18/20 0122 03/18/20 0954  WBC 8.3 8.1 7.4  RBC 4.43 3.10* 2.88*  HGB 15.0 10.6* 9.7*  HCT 45.6 32.6* 30.8*  MCV 102.9* 105.2* 106.9*  MCH 33.9 34.2* 33.7  MCHC 32.9 32.5 31.5  RDW 12.1 12.3 12.3  PLT 409* 297 253   BNP Recent Labs  Lab 03/17/20 0311  BNP 235.9*    DDimer  Recent Labs  Lab 03/17/20 0311 03/18/20 0122  DDIMER 18.06* 9.31*     Radiology/Studies:  CT Angio Aortobifemoral W and/or Wo Contrast  Result Date: 03/17/2020 CLINICAL DATA:  Right knee pain with right leg and foot numbness for several hours. Elevated D-dimer. No pulses in the right leg. EXAM: CT ANGIOGRAPHY OF ABDOMINAL AORTA WITH ILIOFEMORAL RUNOFF TECHNIQUE: Multidetector CT imaging of the abdomen, pelvis and lower extremities was performed using the standard protocol during bolus administration of intravenous contrast. Multiplanar CT image reconstructions and MIPs were obtained to evaluate the vascular anatomy. CONTRAST:  80mL OMNIPAQUE IOHEXOL 350 MG/ML SOLN COMPARISON:  None. FINDINGS: VASCULAR Aorta: Normal caliber aorta without aneurysm, dissection, vasculitis or significant stenosis. Scattered aortic calcifications. Celiac: Patent without evidence of aneurysm, dissection,  vasculitis or significant stenosis. SMA: Patent without evidence of aneurysm, dissection, vasculitis or significant stenosis. Renals: Both renal arteries are patent without evidence of aneurysm, dissection, vasculitis, fibromuscular dysplasia or significant stenosis. There is a small accessory right renal artery to the lower pole which also appears patent. IMA: Patent without evidence of aneurysm, dissection, vasculitis or significant stenosis. RIGHT Lower Extremity Inflow: Thrombosis demonstrated in the common iliac artery extending into the proximal external iliac artery and into the internal iliac artery. There is a small string sign of flow around the thrombus in the common iliac and external iliac arteries with no flow demonstrated in the internal iliac artery. There is collateral reconstitution of flow to the external iliac artery. Outflow: Common, superficial and profunda femoral arteries and the popliteal artery are patent without evidence of aneurysm, dissection, vasculitis or significant stenosis. Runoff: Nonocclusive filling defect in the popliteal artery probably thrombus. There is complete occlusion of the distal popliteal artery with no flow demonstrated in the tibial trunk or trifurcation vessels. 0 vessel runoff to the ankle is demonstrated. LEFT Lower Extremity Inflow: Common, internal and external iliac arteries are patent without evidence of aneurysm, dissection, vasculitis or significant stenosis. Outflow: Common, superficial and profunda femoral arteries and the popliteal artery are patent without evidence of aneurysm, dissection, vasculitis or significant stenosis. Runoff: Patent three vessel runoff to the ankle. Veins: No obvious venous abnormality within the limitations of this arterial phase study. Review of the MIP images confirms the above findings. NON-VASCULAR Lower chest: Diffuse airspace disease in the visualized lung bases, likely edema. Pneumonia would be a secondary consideration.  Hepatobiliary: No focal liver abnormality is seen. Status post cholecystectomy. No biliary dilatation. Pancreas: Unremarkable. No pancreatic ductal dilatation or surrounding inflammatory changes. Spleen: Normal in size without focal abnormality. Adrenals/Urinary Tract: Adrenal glands are unremarkable. Kidneys are normal, without renal calculi, focal lesion, or hydronephrosis. Bladder is unremarkable. Stomach/Bowel: Stomach is within normal limits. Appendix  appears normal. No evidence of bowel wall thickening, distention, or inflammatory changes. Lymphatic: No significant lymphadenopathy. Reproductive: Uterus and bilateral adnexa are unremarkable. Other: No abdominal wall hernia or abnormality. No abdominopelvic ascites. Musculoskeletal: No acute or significant osseous findings. IMPRESSION: VASCULAR Occlusive filling defect, likely thrombosis in the right common iliac artery extending into the external iliac and internal iliac arteries. Collateral reconstitution of the external iliac artery. Focal nonocclusive filling defect in the right mid popliteal artery. Occlusion of the distal popliteal artery extending into the tibial trunk and trifurcation vessels with no flow demonstrated in the right calf runoff vessels. NON-VASCULAR Diffuse airspace disease in the visualized lung bases is likely due to edema. Electronically Signed   By: Burman Nieves M.D.   On: 03/17/2020 07:13   DG Chest Port 1 View  Result Date: 03/17/2020 CLINICAL DATA:  CHF EXAM: PORTABLE CHEST 1 VIEW COMPARISON:  None. FINDINGS: Diffuse interstitial prominence with patchy perihilar/bibasilar opacities. No pneumothorax or pleural effusion. Cardiomegaly. No acute osseous abnormality. IMPRESSION: Mild pulmonary edema and cardiomegaly. Electronically Signed   By: Stana Bunting M.D.   On: 03/17/2020 07:30   ECHOCARDIOGRAM COMPLETE  Result Date: 03/18/2020    ECHOCARDIOGRAM REPORT   Patient Name:   CHANISE HABECK Date of Exam: 03/18/2020  Medical Rec #:  [id number]       Height:       66.0 in Accession #:    [id number]      Weight:       198.6 lb Date of Birth:  12-Mar-1961       BSA:          1.994 m Patient Age:    59 years        BP:           103/63 mmHg Patient Gender: F               HR:           60 bpm. Exam Location:  Inpatient Procedure: 2D Echo, Cardiac Doppler and Color Doppler Indications:    I48.0 Paroxysmal atrial fibrillation  History:        Patient has no prior history of Echocardiogram examinations.                 COVID-19 Positive.  Sonographer:    Tiffany Dance Referring Phys: [id number] RONDELL A SMITH IMPRESSIONS  1. Left ventricular ejection fraction, by estimation, is 45 to 50%. The left ventricle has mildly decreased function. The left ventricle demonstrates global hypokinesis. The left ventricular internal cavity size was mildly dilated. There is mild concentric left ventricular hypertrophy. Left ventricular diastolic parameters are consistent with Grade III diastolic dysfunction (restrictive). Elevated left atrial pressure.  2. Right ventricular systolic function is mildly reduced. The right ventricular size is normal. There is mildly elevated pulmonary artery systolic pressure. The estimated right ventricular systolic pressure is 38.3 mmHg.  3. Left atrial size was severely dilated.  4. There is leaflet malcoaptation. Effective regurgitant orifica area is 0.4 cm, regurgitant volume 54 mL, regurgitant fraction 60%. The mitral valve appears structurally to be grossly normal. Severe mitral valve regurgitation. No evidence of mitral stenosis.  5. Tricuspid valve regurgitation is moderate.  6. The aortic valve is tricuspid. Aortic valve regurgitation is not visualized. No aortic stenosis is present.  7. The inferior vena cava is normal in size with greater than 50% respiratory variability, suggesting right atrial pressure of 3 mmHg. Conclusion(s)/Recommendation(s): Consider TEE to better evaluate the mechanism and severity  of  mitral insufficiency. FINDINGS  Left Ventricle: Left ventricular ejection fraction, by estimation, is 45 to 50%. The left ventricle has mildly decreased function. The left ventricle demonstrates global hypokinesis. The left ventricular internal cavity size was mildly dilated. There is  mild concentric left ventricular hypertrophy. Left ventricular diastolic parameters are consistent with Grade III diastolic dysfunction (restrictive). Elevated left atrial pressure. Right Ventricle: The right ventricular size is normal. Right vetricular wall thickness was not well visualized. Right ventricular systolic function is mildly reduced. There is mildly elevated pulmonary artery systolic pressure. The tricuspid regurgitant velocity is 2.97 m/s, and with an assumed right atrial pressure of 3 mmHg, the estimated right ventricular systolic pressure is 38.3 mmHg. Left Atrium: Left atrial size was severely dilated. Right Atrium: Right atrial size was normal in size. Pericardium: There is no evidence of pericardial effusion. Mitral Valve: There is leaflet malcoaptation. Effective regurgitant orifice area is 0.4 cm, regurgitant volume 54 mL, regurgitant fraction 60%. The mitral valve is grossly normal. Mild mitral annular calcification. Severe mitral valve regurgitation, with centrally-directed jet. No evidence of mitral valve stenosis. Pulmonary venous flow shows systolic flow reversal. Tricuspid Valve: The tricuspid valve is normal in structure. Tricuspid valve regurgitation is moderate. Aortic Valve: The aortic valve is tricuspid. Aortic valve regurgitation is not visualized. No aortic stenosis is present. Pulmonic Valve: The pulmonic valve was normal in structure. Pulmonic valve regurgitation is trivial. Aorta: The aortic root and ascending aorta are structurally normal, with no evidence of dilitation. Venous: The inferior vena cava is normal in size with greater than 50% respiratory variability, suggesting right atrial  pressure of 3 mmHg. IAS/Shunts: No atrial level shunt detected by color flow Doppler.  LEFT VENTRICLE PLAX 2D LVIDd:         5.35 cm      Diastology LVIDs:         3.81 cm      LV e' medial:    4.79 cm/s LV PW:         1.44 cm      LV E/e' medial:  26.1 LV IVS:        1.13 cm      LV e' lateral:   6.85 cm/s LVOT diam:     2.10 cm      LV E/e' lateral: 18.2 LV SV:         36 LV SV Index:   18 LVOT Area:     3.46 cm  LV Volumes (MOD) LV vol d, MOD A2C: 85.5 ml LV vol d, MOD A4C: 110.6 ml LV vol s, MOD A2C: 50.5 ml LV vol s, MOD A4C: 57.3 ml LV SV MOD A2C:     35.0 ml LV SV MOD A4C:     53.3 ml RIGHT VENTRICLE            IVC RV Basal diam:  3.26 cm    IVC diam: 1.10 cm RV S prime:     6.53 cm/s TAPSE (M-mode): 1.9 cm LEFT ATRIUM             Index       RIGHT ATRIUM           Index LA diam:        5.00 cm 2.51 cm/m  RA Area:     18.90 cm LA Vol (A2C):   74.0 ml 37.11 ml/m RA Volume:   51.30 ml  25.73 ml/m LA Vol (A4C):   93.8 ml 47.04 ml/m LA Biplane  Vol: 86.6 ml 43.43 ml/m  AORTIC VALVE LVOT Vmax:   59.80 cm/s LVOT Vmean:  35.700 cm/s LVOT VTI:    0.104 m  AORTA Ao Root diam: 3.10 cm Ao Asc diam:  2.70 cm MITRAL VALVE                 TRICUSPID VALVE MV Area (PHT): 6.53 cm      TR Peak grad:   35.3 mmHg MV Decel Time: 116 msec      TR Vmax:        297.00 cm/s MR Peak grad:    78.9 mmHg MR Mean grad:    56.0 mmHg   SHUNTS MR Vmax:         444.00 cm/s Systemic VTI:  0.10 m MR Vmean:        354.0 cm/s  Systemic Diam: 2.10 cm MR PISA:         4.02 cm MR PISA Eff ROA: 36 mm MR PISA Radius:  0.80 cm MV E velocity: 125.00 cm/s MV A velocity: 40.60 cm/s MV E/A ratio:  3.08 Mihai Croitoru MD Electronically signed by Thurmon Fair MD Signature Date/Time: 03/18/2020/12:40:24 PM    Final      Assessment and Plan:   1. Newly diagnosed atrial fibrillation/atrial flutter -currently in sinus rhythm after initiating IV amiodarone  -Unclear duration due to lack of cardiac awareness.  Severely enlarged left atrium with  borderline low EF.    -Although IV amiodarone is not ideal given possible embolic event, however patient is currently converted on IV amiodarone and maintaining sinus bradycardia.  We will continue on current therapy  -Given high likelihood of recurrence, patient likely will require anticoagulation therapy once deemed low risk for bleeding from vascular surgery perspective    2. Acute combined systolic and diastolic heart failure  -Echocardiogram demonstrated borderline low EF 45 to 50% with grade 3 diastolic dysfunction  -According to the patient, she was first diagnosed with heart failure in 2017, and underwent cardiac catheterization in [place].  She does not remember her ejection fraction at the time.  However was told that she did not have significant coronary artery disease.  She does not remember the name of her cardiologist nor the hospital name.  -Initial chest x-ray showed mild heart failure, she appears to be near euvolemic level, received a dose of 40 mg IV Lasix both yesterday and today.  -Prior to arrival, she was already on carvedilol and lisinopril  3. Severe MR on echocardiogram  -Seen on echocardiogram, given the COVID-19 status and the lack of significant symptoms, recommend outpatient TEE once recovered from COVID-19.  4. Embolic event to the right leg: Underwent thrombectomy and 4 compartment fasciotomy.  It is concerning that patient likely had embolic event from atrial fibrillation/atrial flutter  5. COVID-19: COVID-19 positive.  Patient apparently has been feeling ill since returning from a funeral last week  6. Hypertension: On carvedilol and lisinopril at home  7. Hyperlipidemia: On Zocor at home  8. Prediabetes: Hemoglobin A1c 5.9         New [place] Heart Association (NYHA) Functional Class NYHA Class II   CHA2DS2-VASc Score = 4  This indicates a 4.8% annual risk of stroke. The patient's score is based upon: CHF History: 1 HTN History: 1 Diabetes History:  0 Stroke History: 0 Vascular Disease History: 1 Age Score: 0 Gender Score: 1         For questions or updates, please contact [facility] Please consult [email or web address]  for contact info under    Signed, Azalee Course, [place]  03/18/2020 2:46 PM   History and all data above reviewed.  Patient examined.  I agree with the findings as above.  The patient presented with right leg pain and is found to have acute thrombus as above.  She was paroxysmal yet in atrial fibrillation which converted after she was treated with IV amiodarone.  She is found to have a mildly reduced ejection fraction and severe mitral regurgitation.  She is also found to be Covid positive.  She never feels palpitations.  She is never been told she had atrial fibrillation before.  She is not sure that she had a reduced ejection fraction but she does state that she had catheterization with normal coronaries previously.  She has been told that she had a leaky heart valve.  This was all in [place] and we do not have any of these records.  She does have shortness of breath.  She says she has been sleeping in bed with 6 pillows for years because she has some trouble lying flat back.  She had dyspnea with exertion and has noticed this more recently.  She is up here from [place] for the past several months taking care of her mother and her grandmother.  Her grandmother died and she wants to move her mother back down to [place] in a month or so.  In the meantime she is living in a home with stairs she notices that she does have to stop halfway up the stairs and catch her breath.  She is not describing new weight gain or edema.  She does not describe any chest pressure, neck or arm discomfort.  The patient exam reveals COR: Irregular, 3 out of 6 axillary systolic murmur holosystolic, no diastolic,  Lungs: Diffuse bilateral coarse crackles,  Abd: Positive bowel sounds normal in frequency pitch, rebound, guarding , Ext: Mild right leg edema with surgical  scars.  All available labs, radiology testing, previous records reviewed. Agree with documented assessment and plan.  Atrial fibrillation: The patient had paroxysmal atrial fibrillation.  Given this and the acute thrombus she needs long-term anticoagulation likely for life.  We can discontinue the IV amiodarone however.:  Arterial thrombus: The patient is on anticoagulation.  The etiology of this is not clear.  TEE is not really indicated to look for source that she will require anticoagulation regardless and would need TEE down the road to look at her MR.  Mitral regurgitation: The patient will need management of this with follow-up TEE when she gets back to [place].  She has not followed up with cardiology although she was supposed to.  She needs to reestablish with them.  Rollene Rotunda  4:33 PM  03/18/2020

## 2020-03-19 ENCOUNTER — Inpatient Hospital Stay (HOSPITAL_COMMUNITY): Payer: Medicare Other

## 2020-03-19 DIAGNOSIS — U071 COVID-19: Secondary | ICD-10-CM | POA: Diagnosis not present

## 2020-03-19 DIAGNOSIS — I480 Paroxysmal atrial fibrillation: Secondary | ICD-10-CM | POA: Diagnosis not present

## 2020-03-19 DIAGNOSIS — I8290 Acute embolism and thrombosis of unspecified vein: Secondary | ICD-10-CM | POA: Diagnosis not present

## 2020-03-19 DIAGNOSIS — R7989 Other specified abnormal findings of blood chemistry: Secondary | ICD-10-CM | POA: Diagnosis not present

## 2020-03-19 LAB — COMPREHENSIVE METABOLIC PANEL
ALT: 21 U/L (ref 0–44)
AST: 52 U/L — ABNORMAL HIGH (ref 15–41)
Albumin: 2.1 g/dL — ABNORMAL LOW (ref 3.5–5.0)
Alkaline Phosphatase: 180 U/L — ABNORMAL HIGH (ref 38–126)
Anion gap: 15 (ref 5–15)
BUN: 32 mg/dL — ABNORMAL HIGH (ref 8–23)
CO2: 17 mmol/L — ABNORMAL LOW (ref 22–32)
Calcium: 7.4 mg/dL — ABNORMAL LOW (ref 8.9–10.3)
Chloride: 106 mmol/L (ref 98–111)
Creatinine, Ser: 1.07 mg/dL — ABNORMAL HIGH (ref 0.44–1.00)
GFR, Estimated: 59 mL/min — ABNORMAL LOW (ref >60)
Glucose, Bld: 173 mg/dL — ABNORMAL HIGH (ref 70–99)
Potassium: 4.5 mmol/L (ref 3.5–5.1)
Sodium: 138 mmol/L (ref 135–145)
Total Bilirubin: 1.3 mg/dL — ABNORMAL HIGH (ref 0.3–1.2)
Total Protein: 6.8 g/dL (ref 6.5–8.1)

## 2020-03-19 LAB — CBC
HCT: 34.1 % — ABNORMAL LOW (ref 36.0–46.0)
Hemoglobin: 10.8 g/dL — ABNORMAL LOW (ref 12.0–15.0)
MCH: 33.6 pg (ref 26.0–34.0)
MCHC: 31.7 g/dL (ref 30.0–36.0)
MCV: 106.2 fL — ABNORMAL HIGH (ref 80.0–100.0)
Platelets: 324 10*3/uL (ref 150–400)
RBC: 3.21 MIL/uL — ABNORMAL LOW (ref 3.87–5.11)
RDW: 12.3 % (ref 11.5–15.5)
WBC: 10.1 10*3/uL (ref 4.0–10.5)
nRBC: 0 % (ref 0.0–0.2)

## 2020-03-19 LAB — URINE CULTURE: Culture: NO GROWTH

## 2020-03-19 LAB — GLUCOSE, CAPILLARY
Glucose-Capillary: 160 mg/dL — ABNORMAL HIGH (ref 70–99)
Glucose-Capillary: 175 mg/dL — ABNORMAL HIGH (ref 70–99)
Glucose-Capillary: 142 mg/dL — ABNORMAL HIGH (ref 70–99)
Glucose-Capillary: 160 mg/dL — ABNORMAL HIGH (ref 70–99)

## 2020-03-19 LAB — TSH: TSH: 1.942 u[IU]/mL (ref 0.350–4.500)

## 2020-03-19 LAB — FERRITIN: Ferritin: 2218 ng/mL — ABNORMAL HIGH (ref 11–307)

## 2020-03-19 LAB — C-REACTIVE PROTEIN: CRP: 8.6 mg/dL — ABNORMAL HIGH (ref <1.0)

## 2020-03-19 LAB — HEPARIN LEVEL (UNFRACTIONATED): Heparin Unfractionated: 0.92 IU/mL — ABNORMAL HIGH (ref 0.30–0.70)

## 2020-03-19 LAB — D-DIMER, QUANTITATIVE: D-Dimer, Quant: 3.25 ug/mL-FEU — ABNORMAL HIGH (ref 0.00–0.50)

## 2020-03-19 LAB — MAGNESIUM: Magnesium: 2.1 mg/dL (ref 1.7–2.4)

## 2020-03-19 LAB — PHOSPHORUS: Phosphorus: 2.7 mg/dL (ref 2.5–4.6)

## 2020-03-19 MED ORDER — INFLUENZA VAC SPLIT QUAD 0.5 ML IM SUSY
0.5000 mL | PREFILLED_SYRINGE | INTRAMUSCULAR | Status: AC
  Administered 2020-03-20: 0.5 mL via INTRAMUSCULAR
  Filled 2020-03-19: qty 0.5

## 2020-03-19 MED ORDER — APIXABAN 5 MG PO TABS
5.0000 mg | ORAL_TABLET | Freq: Two times a day (BID) | ORAL | Status: DC
  Administered 2020-03-19 – 2020-03-22 (×7): 5 mg via ORAL
  Filled 2020-03-19 (×7): qty 1

## 2020-03-19 NOTE — Progress Notes (Signed)
VASCULAR LAB    Bilateral lower extremity venous duplex has been performed.  See CV proc for preliminary results.      Sidney Kann, RVT 03/19/2020, 1:18 PM

## 2020-03-19 NOTE — Progress Notes (Signed)
ANTICOAGULATION CONSULT NOTE - Follow Up Consult  Pharmacy Consult for Heparin --> apixaban Indication: Afib and acute LE ischemia  Anticoag: Heparin for new Afib. Acute right lower extremity ischemia from afib vs COVID clotting. Ddimer 18>9.31. Hep level 1.44>0.41. Hgb 15>10.6 (EBL in OR 11/25). Plts [id number]. CHA2DS2-VASc score =5.  - 11/25: s/p OR for fem-pop thrombectomy, Anterior tibial and Peroneal thrombectomy and 4 compartment calf fasciotomies right lower extremity. Heparin was never off per RN (had heparin irrigation in the OR). Still high risk for re-thrombosis and limb loss given COVID19 11/26:  Heparin level 0.48 units/ml  - 11/27: consulted to transition to apixaban. No bleeding noted. Hgb up to 10.8, platelets are stable.   Plan:  Discontinue heparin drip Apixaban 5 mg PO bid - discussed dosing with Dr Lenell Antu Education to be completed prior to discharge Pharmacy signing off but will continue to follow peripherally - please re-consult if needed  Thank you for involving pharmacy in this patient's care.  Loura Back, PharmD, BCPS Clinical Pharmacist Clinical phone for 03/19/2020 until 3p is [phone] 03/19/2020 10:42 AM  **Pharmacist phone directory can be found on [email or web address] listed under [facility]**

## 2020-03-19 NOTE — Progress Notes (Signed)
SATURATION QUALIFICATIONS: (This note is used to comply with regulatory documentation for home oxygen)  Patient Saturations on Room Air at Rest = 86%  Patient Saturations on Room Air while Ambulating = NA due to drop at rest on room air  Patient Saturations on 3 Liters of oxygen while Ambulating = 90%  Please briefly explain why patient needs home oxygen:  To maintain oxygenation >87% while completing mobility and functional activities.   Jerolyn Center, PT Pager [phone]

## 2020-03-19 NOTE — Progress Notes (Signed)
Physical Therapy Treatment Patient Details Name: Patricia Atkinson MRN: [id number] DOB: 21-Jul-1960 Today's Date: 03/19/2020    History of Present Illness 59 y.o. female present to ED 03/17/20 for evaluation of sudden onset right leg pain.  CT aortobifem done which showed an occlusive filling defect right common iliac extending into the external and internal iliac arteries. New afib; +COVID (unvaccinated); 11/25 multiple thrombectomies RLE plus 4 compartment fasciotomies    PT Comments    Patient sleeping on arrival with no awareness that her breakfast was at bedside. Agreeable to OOB, walk, and sit up in chair. She continues with slow processing and requires repeated cues. She was not able to maintain sats on room air (or even 2L O2) requiring 3L for sats 89-90% while walking.     Follow Up Recommendations  Home health PT;Supervision - Intermittent     Equipment Recommendations  Rolling walker with 5" wheels    Recommendations for Other Services       Precautions / Restrictions Precautions Precautions: Fall    Mobility  Bed Mobility Overal bed mobility: Needs Assistance Bed Mobility: Supine to Sit     Supine to sit: Supervision     General bed mobility comments: increased time and cues for sequencing  Transfers Overall transfer level: Needs assistance Equipment used: Rolling walker (2 wheeled) Transfers: Sit to/from Stand Sit to Stand: Min guard         General transfer comment: no physical assist; multiple verbal cues for safest hand placement with use of RW (repeatedly wtih pt still pulling on RW with one UE)  Ambulation/Gait Ambulation/Gait assistance: Min assist Gait Distance (Feet): 40 Feet Assistive device: Rolling walker (2 wheeled) Gait Pattern/deviations: Step-to pattern;Decreased stride length Gait velocity: very slow   General Gait Details: vc for sequencing with pt tending to take an extra step with RLE each time; slow, steady; vc for  sequencing   Stairs             Wheelchair Mobility    Modified Rankin (Stroke Patients Only)       Balance Overall balance assessment: Needs assistance Sitting-balance support: Feet supported;Bilateral upper extremity supported Sitting balance-Leahy Scale: Good     Standing balance support: Bilateral upper extremity supported;During functional activity Standing balance-Leahy Scale: Poor Standing balance comment: reliant on external support of RW or countertop at sink this date                            Cognition Arousal/Alertness: Awake/alert Behavior During Therapy: WFL for tasks assessed/performed Overall Cognitive Status: Impaired/Different from baseline Area of Impairment: Problem solving                             Problem Solving: Slow processing;Decreased initiation;Difficulty sequencing;Requires verbal cues General Comments: Slightly less delayed with following commands compared to 11/26      Exercises Other Exercises Other Exercises: ankle pumps x 20 prior to OOB    General Comments General comments (skin integrity, edema, etc.): required 3L to keep sats >87% (see amb sat note)      Pertinent Vitals/Pain Pain Assessment: 0-10 Pain Score: 4  Pain Location: RLE Pain Descriptors / Indicators: Tender;Aching Pain Intervention(s): Monitored during session;Repositioned    Home Living                      Prior Function  PT Goals (current goals can now be found in the care plan section) Acute Rehab PT Goals Patient Stated Goal: return to independence Time For Goal Achievement: 04/01/20 Potential to Achieve Goals: Good Progress towards PT goals: Progressing toward goals    Frequency    Min 3X/week      PT Plan Current plan remains appropriate    Co-evaluation              AM-PAC PT "6 Clicks" Mobility   Outcome Measure  Help needed turning from your back to your side while in a flat  bed without using bedrails?: None Help needed moving from lying on your back to sitting on the side of a flat bed without using bedrails?: None Help needed moving to and from a bed to a chair (including a wheelchair)?: A Little Help needed standing up from a chair using your arms (e.g., wheelchair or bedside chair)?: A Little Help needed to walk in hospital room?: A Little Help needed climbing 3-5 steps with a railing? : A Little 6 Click Score: 20    End of Session Equipment Utilized During Treatment: Oxygen Activity Tolerance: Patient tolerated treatment well Patient left: in chair;with call bell/phone within reach;Other (comment) (RN agreed chair alarm not indicated;) Nurse Communication: Mobility status PT Visit Diagnosis: Pain;Difficulty in walking, not elsewhere classified (R26.2) Pain - Right/Left: Right Pain - part of body: Leg     Time: 0071-2197 PT Time Calculation (min) (ACUTE ONLY): 34 min  Charges:  $Gait Training: 8-22 mins $Therapeutic Activity: 8-22 mins                      Jerolyn Center, PT Pager [phone]    Zena Amos 03/19/2020, 10:10 AM

## 2020-03-19 NOTE — Progress Notes (Addendum)
PROGRESS NOTE    Cheila Wickstrom  EXH:[id number] DOB: 1961-02-28 DOA: 03/17/2020 PCP: Patient, No Pcp Per   Brief Narrative:  59 y.o. female with medical history significant of hypertension hyperlipidemia who presents with complaints of right leg pain which started yesterday afternoon.  Pain was described as sharp and severe.  Denied having any injury to onset of pain.  Associated symptoms include mild cough, mild short of breath, and diarrhea which started attending a family member's funeral 1 week ago.  Patient notes that she was not vaccinated against COVID-19.  Denies having any significant vomiting or change in smell/taste.  She had gone to the emergency department and was evaluated and found to be in atrial fibrillation.  She was found to have no dopplerable pulses of the right leg and CT revealed an occlusive filling defect of the right common iliac extending into the external and internal iliac arteries and occlusion of the distal popliteal artery.  She was given heparin bolus and started on heparin drip.  Vascular surgery admitted the patient for open thrombectomy. Chest x-ray was concerning for mild pulmonary edema and cardiomegaly. COVID-19 screening was positive.  Labs revealed WBC 8.3, hemoglobin 15, platelets 409, BUN 51, creatinine 1.4, lactic acid 2.7, and D-dimer 18.06.  Assessment & Plan:   Principal Problem:   Thrombus Active Problems:   Atrial fibrillation (HCC)   COVID-19 virus infection   Acute CHF (congestive heart failure) (HCC)   Essential hypertension   #1 acute hypoxic respiratory failure likely multifactorial from Covid pneumonia versus acute CHF versus could she have a PE.  Patient admitted with shortness of breath and cough.  She was found to be COVID-19 positive.  She was not on oxygen prior to admission to hospital.  Her oxygen saturation has improved on 3 L satting above 99%. Her BNP was 235.9. Her D-dimer is 18.06. Venous Doppler shows right lower extremity  acute DVT involving the right popliteal vein and right gastrocnemius veins.Patient has acute filling defect in the right common iliac arteries consistent with thrombosis of the right common iliac arteries.  Have not done a CT of the chest due to elevated creatinine. She has been started on Eliquis 5 mg twice a day 03/19/2020.  #2 acute combined CHF exacerbation -she is still positive by 3.5 L.  She got a dose of Lasix yesterday and day before.  Followed by cardiology will defer to them for diuresis.  Echo with ejection fraction 45 to 50%.  Left ventricle demonstrates global hypokinesis.  Grade 3 diastolic dysfunction.  #3 new onset atrial fibrillation CHA2DS2-VASc score is 5.  Patient has converted to normal sinus rhythm amiodarone stopped by cardiology.  Continue Eliquis.    #4 Covid positive with chest x-ray infiltrates versus fluid overload with elevated D-dimer.  Continue remdesivir steroids and supportive treatment.  Encourage incentive spirometry flutter valve out of bed as much as tolerated. D-dimer 3.25 from 18.06. CRP 8.6 from 18.7.  #5 possible AKI versus CKD unknown baseline, creatinine 1.07 down from 1.40.  #6 history of essential hypertension-blood pressure soft on Coreg.    #5 right common iliac artery acute thrombus-started Eliquis by vascular 03/19/2020.  Right lower extremity ischemia.  She is status post iliofemoral thrombectomy femoropopliteal thrombectomy and anterior tibial and peroneal thrombectomy and 4 compartment calf fasciotomies of the right lower extremity.  #6 hypercoagulable state likely induced by Covid with arterial and venous thrombosis on Eliquis.  #7 right lower extremity acute DVT-patient will need higher dose of Eliquis.  She  is currently on Eliquis 5 mg twice daily.  Will discuss with primary to see if we can increase to 10 twice daily due to questionable risk of increased bleeding after vascular surgery.  Estimated body mass index is 33.38 kg/m as  calculated from the following:   Height as of this encounter: 5\' 6"  (1.676 m).   Weight as of this encounter: 93.8 kg.  DVT prophylaxis: eliquis  code Status: Full code Family Communication: None at bedside  Subjective: She is resting in bed reports that she is feeling better denies any chest pain Breathing seems to be okay  Objective: Vitals:   03/19/20 0735 03/19/20 0825 03/19/20 0900 03/19/20 1149  BP: 116/78 117/70  101/61  Pulse: 69 71 75 64  Resp: 19  (!) 22 20  Temp: 97.6 F (36.4 C)   97.9 F (36.6 C)  TempSrc: Oral   Oral  SpO2: 100%  95% 99%  Weight:      Height:        Intake/Output Summary (Last 24 hours) at 03/19/2020 1403 Last data filed at 03/19/2020 0100 Gross per 24 hour  Intake 943.03 ml  Output 400 ml  Net 543.03 ml   Filed Weights   03/17/20 0602 03/17/20 1548 03/19/20 0500  Weight: 87.5 kg 90.1 kg 93.8 kg    Examination:  General exam: Appears calm and comfortable  Respiratory system: Clear to auscultation. Respiratory effort normal. Cardiovascular system: S1 & S2 heard, RRR. No JVD, murmurs, rubs, gallops or clicks. No pedal edema. Gastrointestinal system: Abdomen is nondistended, soft and nontender. No organomegaly or masses felt. Normal bowel sounds heard. Central nervous system: Alert and oriented. No focal neurological deficits. Extremities: Right lower extremity 1+ edema incision sites covered with dressing intact. Skin: No rashes, lesions or ulcers Psychiatry: Judgement and insight appear normal. Mood & affect appropriate.     Data Reviewed: I have personally reviewed following labs and imaging studies  CBC: Recent Labs  Lab 03/17/20 0311 03/18/20 0122 03/18/20 0954 03/19/20 0323  WBC 8.3 8.1 7.4 10.1  NEUTROABS 6.5  --   --   --   HGB 15.0 10.6* 9.7* 10.8*  HCT 45.6 32.6* 30.8* 34.1*  MCV 102.9* 105.2* 106.9* 106.2*  PLT 409* 297 253 324   Basic Metabolic Panel: Recent Labs  Lab 03/17/20 0311 03/18/20 0122  03/19/20 0323  NA 135 138 138  K 3.7 4.1 4.5  CL 101 106 106  CO2 21* 20* 17*  GLUCOSE 157* 197* 173*  BUN 51* 31* 32*  CREATININE 1.40* 1.03* 1.07*  CALCIUM 8.4* 7.5* 7.4*  MG  --  2.2 2.1  PHOS  --  3.6 2.7   GFR: Estimated Creatinine Clearance: 63.7 mL/min (A) (by C-G formula based on SCr of 1.07 mg/dL (H)). Liver Function Tests: Recent Labs  Lab 03/18/20 0122 03/19/20 0323  AST 32 52*  ALT 19 21  ALKPHOS 183* 180*  BILITOT 1.2 1.3*  PROT 6.3* 6.8  ALBUMIN 2.0* 2.1*   No results for input(s): LIPASE, AMYLASE in the last 168 hours. No results for input(s): AMMONIA in the last 168 hours. Coagulation Profile: Recent Labs  Lab 03/17/20 0311  INR 1.3*   Cardiac Enzymes: Recent Labs  Lab 03/17/20 0311  CKTOTAL 41   BNP (last 3 results) No results for input(s): PROBNP in the last 8760 hours. HbA1C: Recent Labs    03/18/20 0122 03/18/20 0954  HGBA1C 5.9* 6.0*   CBG: Recent Labs  Lab 03/18/20 1314 03/18/20 1750 03/18/20  2104 03/19/20 0741 03/19/20 1146  GLUCAP 196* 167* 194* 160* 175*   Lipid Profile: Recent Labs    03/18/20 0122  CHOL 168  HDL 17*  LDLCALC 121*  TRIG 149  CHOLHDL 9.9   Thyroid Function Tests: Recent Labs    03/19/20 0323  TSH 1.942   Anemia Panel: Recent Labs    03/18/20 0122 03/19/20 0323  FERRITIN 2,236* 2,218*   Sepsis Labs: Recent Labs  Lab 03/17/20 0311 03/17/20 1856  PROCALCITON  --  0.54  LATICACIDVEN 2.7*  --     Recent Results (from the past 240 hour(s))  Resp Panel by RT-PCR (Flu A&B, Covid) Nasopharyngeal Swab     Status: Abnormal   Collection Time: 03/17/20  7:12 AM   Specimen: Nasopharyngeal Swab; Nasopharyngeal(NP) swabs in vial transport medium  Result Value Ref Range Status   SARS Coronavirus 2 by RT PCR POSITIVE (A) NEGATIVE Final    Comment: RESULT CALLED TO, READ BACK BY AND VERIFIED WITH: HAVILAND,J DR @0857  ON 03/17/20 JACKSON,K (NOTE) SARS-CoV-2 target nucleic acids are  DETECTED.  The SARS-CoV-2 RNA is generally detectable in upper respiratory specimens during the acute phase of infection. Positive results are indicative of the presence of the identified virus, but do not rule out bacterial infection or co-infection with other pathogens not detected by the test. Clinical correlation with patient history and other diagnostic information is necessary to determine patient infection status. The expected result is Negative.  Fact Sheet for Patients: BloggerCourse.com  Fact Sheet for Healthcare Providers: SeriousBroker.it  This test is not yet approved or cleared by the [place] FDA and  has been authorized for detection and/or diagnosis of SARS-CoV-2 by FDA under an Emergency Use Authorization (EUA).  This EUA will remain in effect (meaning this tes t can be used) for the duration of  the COVID-19 declaration under Section 564(b)(1) of the Act, 21 U.S.C. section 360bbb-3(b)(1), unless the authorization is terminated or revoked sooner.     Influenza A by PCR NEGATIVE NEGATIVE Final   Influenza B by PCR NEGATIVE NEGATIVE Final    Comment: (NOTE) The Xpert Xpress SARS-CoV-2/FLU/RSV plus assay is intended as an aid in the diagnosis of influenza from Nasopharyngeal swab specimens and should not be used as a sole basis for treatment. Nasal washings and aspirates are unacceptable for Xpert Xpress SARS-CoV-2/FLU/RSV testing.  Fact Sheet for Patients: BloggerCourse.com  Fact Sheet for Healthcare Providers: SeriousBroker.it  This test is not yet approved or cleared by the [place] FDA and has been authorized for detection and/or diagnosis of SARS-CoV-2 by FDA under an Emergency Use Authorization (EUA). This EUA will remain in effect (meaning this test can be used) for the duration of the COVID-19 declaration under Section 564(b)(1) of the Act, 21  U.S.C. section 360bbb-3(b)(1), unless the authorization is terminated or revoked.  Performed at [facility], [address] [place], [place] [address]   Culture, Urine     Status: None   Collection Time: 03/18/20  6:15 PM   Specimen: Urine, Clean Catch  Result Value Ref Range Status   Specimen Description URINE, CLEAN CATCH  Final   Special Requests NONE  Final   Culture   Final    NO GROWTH Performed at [facility], [address] [place], [place] [address]    Report Status 03/19/2020 FINAL  Final         Radiology Studies: ECHOCARDIOGRAM COMPLETE  Result Date: 03/18/2020    ECHOCARDIOGRAM REPORT  Patient Name:   TELESHIA LEMERE Date of Exam: 03/18/2020 Medical Rec #:  [id number]       Height:       66.0 in Accession #:    [id number]      Weight:       198.6 lb Date of Birth:  Aug 10, 1960       BSA:          1.994 m Patient Age:    59 years        BP:           103/63 mmHg Patient Gender: F               HR:           60 bpm. Exam Location:  Inpatient Procedure: 2D Echo, Cardiac Doppler and Color Doppler Indications:    I48.0 Paroxysmal atrial fibrillation  History:        Patient has no prior history of Echocardiogram examinations.                 COVID-19 Positive.  Sonographer:    Tiffany Dance Referring Phys: [id number] RONDELL A SMITH IMPRESSIONS  1. Left ventricular ejection fraction, by estimation, is 45 to 50%. The left ventricle has mildly decreased function. The left ventricle demonstrates global hypokinesis. The left ventricular internal cavity size was mildly dilated. There is mild concentric left ventricular hypertrophy. Left ventricular diastolic parameters are consistent with Grade III diastolic dysfunction (restrictive). Elevated left atrial pressure.  2. Right ventricular systolic function is mildly reduced. The right ventricular size is normal. There is mildly elevated pulmonary artery systolic pressure. The estimated right ventricular systolic  pressure is 38.3 mmHg.  3. Left atrial size was severely dilated.  4. There is leaflet malcoaptation. Effective regurgitant orifica area is 0.4 cm, regurgitant volume 54 mL, regurgitant fraction 60%. The mitral valve appears structurally to be grossly normal. Severe mitral valve regurgitation. No evidence of mitral stenosis.  5. Tricuspid valve regurgitation is moderate.  6. The aortic valve is tricuspid. Aortic valve regurgitation is not visualized. No aortic stenosis is present.  7. The inferior vena cava is normal in size with greater than 50% respiratory variability, suggesting right atrial pressure of 3 mmHg. Conclusion(s)/Recommendation(s): Consider TEE to better evaluate the mechanism and severity of mitral insufficiency. FINDINGS  Left Ventricle: Left ventricular ejection fraction, by estimation, is 45 to 50%. The left ventricle has mildly decreased function. The left ventricle demonstrates global hypokinesis. The left ventricular internal cavity size was mildly dilated. There is  mild concentric left ventricular hypertrophy. Left ventricular diastolic parameters are consistent with Grade III diastolic dysfunction (restrictive). Elevated left atrial pressure. Right Ventricle: The right ventricular size is normal. Right vetricular wall thickness was not well visualized. Right ventricular systolic function is mildly reduced. There is mildly elevated pulmonary artery systolic pressure. The tricuspid regurgitant velocity is 2.97 m/s, and with an assumed right atrial pressure of 3 mmHg, the estimated right ventricular systolic pressure is 38.3 mmHg. Left Atrium: Left atrial size was severely dilated. Right Atrium: Right atrial size was normal in size. Pericardium: There is no evidence of pericardial effusion. Mitral Valve: There is leaflet malcoaptation. Effective regurgitant orifice area is 0.4 cm, regurgitant volume 54 mL, regurgitant fraction 60%. The mitral valve is grossly normal. Mild mitral annular  calcification. Severe mitral valve regurgitation, with centrally-directed jet. No evidence of mitral valve stenosis. Pulmonary venous flow shows systolic flow reversal. Tricuspid Valve: The tricuspid valve is normal in structure. Tricuspid valve  regurgitation is moderate. Aortic Valve: The aortic valve is tricuspid. Aortic valve regurgitation is not visualized. No aortic stenosis is present. Pulmonic Valve: The pulmonic valve was normal in structure. Pulmonic valve regurgitation is trivial. Aorta: The aortic root and ascending aorta are structurally normal, with no evidence of dilitation. Venous: The inferior vena cava is normal in size with greater than 50% respiratory variability, suggesting right atrial pressure of 3 mmHg. IAS/Shunts: No atrial level shunt detected by color flow Doppler.  LEFT VENTRICLE PLAX 2D LVIDd:         5.35 cm      Diastology LVIDs:         3.81 cm      LV e' medial:    4.79 cm/s LV PW:         1.44 cm      LV E/e' medial:  26.1 LV IVS:        1.13 cm      LV e' lateral:   6.85 cm/s LVOT diam:     2.10 cm      LV E/e' lateral: 18.2 LV SV:         36 LV SV Index:   18 LVOT Area:     3.46 cm  LV Volumes (MOD) LV vol d, MOD A2C: 85.5 ml LV vol d, MOD A4C: 110.6 ml LV vol s, MOD A2C: 50.5 ml LV vol s, MOD A4C: 57.3 ml LV SV MOD A2C:     35.0 ml LV SV MOD A4C:     53.3 ml RIGHT VENTRICLE            IVC RV Basal diam:  3.26 cm    IVC diam: 1.10 cm RV S prime:     6.53 cm/s TAPSE (M-mode): 1.9 cm LEFT ATRIUM             Index       RIGHT ATRIUM           Index LA diam:        5.00 cm 2.51 cm/m  RA Area:     18.90 cm LA Vol (A2C):   74.0 ml 37.11 ml/m RA Volume:   51.30 ml  25.73 ml/m LA Vol (A4C):   93.8 ml 47.04 ml/m LA Biplane Vol: 86.6 ml 43.43 ml/m  AORTIC VALVE LVOT Vmax:   59.80 cm/s LVOT Vmean:  35.700 cm/s LVOT VTI:    0.104 m  AORTA Ao Root diam: 3.10 cm Ao Asc diam:  2.70 cm MITRAL VALVE                 TRICUSPID VALVE MV Area (PHT): 6.53 cm      TR Peak grad:   35.3 mmHg MV  Decel Time: 116 msec      TR Vmax:        297.00 cm/s MR Peak grad:    78.9 mmHg MR Mean grad:    56.0 mmHg   SHUNTS MR Vmax:         444.00 cm/s Systemic VTI:  0.10 m MR Vmean:        354.0 cm/s  Systemic Diam: 2.10 cm MR PISA:         4.02 cm MR PISA Eff ROA: 36 mm MR PISA Radius:  0.80 cm MV E velocity: 125.00 cm/s MV A velocity: 40.60 cm/s MV E/A ratio:  3.08 Mihai Croitoru MD Electronically signed by Thurmon Fair MD Signature Date/Time: 03/18/2020/12:40:24 PM    Final  VAS US LOWER EXTREMITY VENOUS (DVT)  Result Date: 03/19/2020  Lower Venous DVT Study Indications: Covid-19, elevated D-Dimer, Atrial fibrillation  Risk Factors: S/p RLE iliofemoral, femoral, popliteal, and tibial thrombectomy, 03/17/20 Anticoagulation: Eliquis. Limitations: Body habitus and bandages. Comparison Study: No prior study Performing Technologist: Sherren Kernsandace Kanady RVS  Examination Guidelines: A complete evaluation includes B-mode imaging, spectral Doppler, color Doppler, and power Doppler as needed of all accessible portions of each vessel. Bilateral testing is considered an integral part of a complete examination. Limited examinations for reoccurring indications may be performed as noted. The reflux portion of the exam is performed with the patient in reverse Trendelenburg.  +---------+---------------+---------+-----------+----------+------------------+ RIGHT    CompressibilityPhasicitySpontaneityPropertiesThrombus Aging     +---------+---------------+---------+-----------+----------+------------------+ CFV      Full                                                            +---------+---------------+---------+-----------+----------+------------------+ SFJ      Full                                                            +---------+---------------+---------+-----------+----------+------------------+ FV Prox  Full                                                             +---------+---------------+---------+-----------+----------+------------------+ FV Mid   Full                                                            +---------+---------------+---------+-----------+----------+------------------+ FV DistalFull                                                            +---------+---------------+---------+-----------+----------+------------------+ PFV      Full                                                            +---------+---------------+---------+-----------+----------+------------------+ POP      Partial        No       No                   Acute, non  occlusive          +---------+---------------+---------+-----------+----------+------------------+ PTV      Full                                                            +---------+---------------+---------+-----------+----------+------------------+ PERO                                                  Not well                                                                 visualized         +---------+---------------+---------+-----------+----------+------------------+ Gastroc  Partial        No       No                   Acute, non                                                               occlusive          +---------+---------------+---------+-----------+----------+------------------+   +---------+---------------+---------+-----------+----------+-------------------+ LEFT     CompressibilityPhasicitySpontaneityPropertiesThrombus Aging      +---------+---------------+---------+-----------+----------+-------------------+ CFV      Full           Yes      Yes                  sluggish flow noted +---------+---------------+---------+-----------+----------+-------------------+ SFJ      Full                                                              +---------+---------------+---------+-----------+----------+-------------------+ FV Prox  Full                                                             +---------+---------------+---------+-----------+----------+-------------------+ FV Mid   Full                                                             +---------+---------------+---------+-----------+----------+-------------------+ FV DistalFull                                                             +---------+---------------+---------+-----------+----------+-------------------+  PFV      Full                                                             +---------+---------------+---------+-----------+----------+-------------------+ POP      Full                                                             +---------+---------------+---------+-----------+----------+-------------------+ PTV      Full                                                             +---------+---------------+---------+-----------+----------+-------------------+ PERO                                                  Not well visualized +---------+---------------+---------+-----------+----------+-------------------+ Gastroc  Full                                                             +---------+---------------+---------+-----------+----------+-------------------+     Summary: RIGHT: - Findings consistent with acute deep vein thrombosis involving the right popliteal vein, and right gastrocnemius veins.  LEFT: - There is no evidence of deep vein thrombosis in the lower extremity. However, portions of this examination were limited- see technologist comments above.  *See table(s) above for measurements and observations. Electronically signed by Heath Lark on 03/19/2020 at 1:54:56 PM.    Final         Scheduled Meds: . albuterol  2 puff Inhalation Q6H  . apixaban  5 mg Oral BID  . vitamin C  500 mg Oral Daily  .  aspirin EC  81 mg Oral Q0600  . carvedilol  12.5 mg Oral BID WC  . Chlorhexidine Gluconate Cloth  6 each Topical Daily  . docusate sodium  100 mg Oral Daily  . insulin aspart  0-5 Units Subcutaneous QHS  . insulin aspart  0-9 Units Subcutaneous TID WC  . methylPREDNISolone (SOLU-MEDROL) injection  0.5 mg/kg Intravenous Q12H   Followed by  . [START ON 03/20/2020] predniSONE  50 mg Oral Daily  . pantoprazole  40 mg Oral Daily  . simvastatin  40 mg Oral q1800  . zinc sulfate  220 mg Oral Daily   Continuous Infusions: . magnesium sulfate bolus IVPB    . remdesivir 100 mg in NS 100 mL 100 mg (03/19/20 0833)     LOS: 2 days   Alwyn Ren, MD 03/19/2020, 2:03 PM

## 2020-03-19 NOTE — Progress Notes (Signed)
Progress Note  Patient Name: Patricia Atkinson Date of Encounter: 03/19/2020  Primary Cardiologist:   No primary care provider on file.   Subjective   She feels fine.  She denies chest pain or SOB.  No leg pain.  She has been moving in the room.   Inpatient Medications    Scheduled Meds: . albuterol  2 puff Inhalation Q6H  . vitamin C  500 mg Oral Daily  . aspirin EC  81 mg Oral Q0600  . carvedilol  12.5 mg Oral BID WC  . Chlorhexidine Gluconate Cloth  6 each Topical Daily  . docusate sodium  100 mg Oral Daily  . insulin aspart  0-5 Units Subcutaneous QHS  . insulin aspart  0-9 Units Subcutaneous TID WC  . methylPREDNISolone (SOLU-MEDROL) injection  0.5 mg/kg Intravenous Q12H   Followed by  . [START ON 03/20/2020] predniSONE  50 mg Oral Daily  . pantoprazole  40 mg Oral Daily  . simvastatin  40 mg Oral q1800  . zinc sulfate  220 mg Oral Daily   Continuous Infusions: . amiodarone 30 mg/hr (03/18/20 2027)  . magnesium sulfate bolus IVPB    . remdesivir 100 mg in NS 100 mL 100 mg (03/19/20 0833)   PRN Meds: acetaminophen **OR** acetaminophen, alum & mag hydroxide-simeth, bisacodyl, chlorpheniramine-HYDROcodone, guaiFENesin-dextromethorphan, hydrALAZINE, HYDROmorphone (DILAUDID) injection, labetalol, magnesium sulfate bolus IVPB, metoprolol tartrate, ondansetron, oxyCODONE, phenol, potassium chloride, senna-docusate, sodium phosphate   Vital Signs    Vitals:   03/19/20 0350 03/19/20 0500 03/19/20 0735 03/19/20 0825  BP: 113/71  116/78 117/70  Pulse: 63  69 71  Resp: 17  19   Temp: 97.6 F (36.4 C)  97.6 F (36.4 C)   TempSrc: Oral  Oral   SpO2: 98%  100%   Weight:  93.8 kg    Height:        Intake/Output Summary (Last 24 hours) at 03/19/2020 0937 Last data filed at 03/19/2020 0100 Gross per 24 hour  Intake 943.03 ml  Output 400 ml  Net 543.03 ml   Filed Weights   03/17/20 0602 03/17/20 1548 03/19/20 0500  Weight: 87.5 kg 90.1 kg 93.8 kg    Telemetry      NSR - Personally Reviewed  ECG    NA - Personally Reviewed  Physical Exam   GEN: No acute distress.   Neck: No  JVD Cardiac: RRR, no murmurs, rubs, or gallops.  Respiratory: Clear  to auscultation bilaterally. GI: Soft, nontender, non-distended  MS:   Mild non pitting right leg edema; No deformity. Neuro:  Nonfocal  Psych: Normal affect   Labs    Chemistry Recent Labs  Lab 03/17/20 0311 03/18/20 0122 03/19/20 0323  NA 135 138 138  K 3.7 4.1 4.5  CL 101 106 106  CO2 21* 20* 17*  GLUCOSE 157* 197* 173*  BUN 51* 31* 32*  CREATININE 1.40* 1.03* 1.07*  CALCIUM 8.4* 7.5* 7.4*  PROT  --  6.3* 6.8  ALBUMIN  --  2.0* 2.1*  AST  --  32 52*  ALT  --  19 21  ALKPHOS  --  183* 180*  BILITOT  --  1.2 1.3*  GFRNONAA 43* >60 59*  ANIONGAP 13 12 15      Hematology Recent Labs  Lab 03/18/20 0122 03/18/20 0954 03/19/20 0323  WBC 8.1 7.4 10.1  RBC 3.10* 2.88* 3.21*  HGB 10.6* 9.7* 10.8*  HCT 32.6* 30.8* 34.1*  MCV 105.2* 106.9* 106.2*  MCH 34.2* 33.7 33.6  MCHC 32.5 31.5 31.7  RDW 12.3 12.3 12.3  PLT 297 253 324    Cardiac EnzymesNo results for input(s): TROPONINI in the last 168 hours. No results for input(s): TROPIPOC in the last 168 hours.   BNP Recent Labs  Lab 03/17/20 0311  BNP 235.9*     DDimer  Recent Labs  Lab 03/17/20 0311 03/18/20 0122 03/19/20 0323  DDIMER 18.06* 9.31* 3.25*     Radiology    ECHOCARDIOGRAM COMPLETE  Result Date: 03/18/2020    ECHOCARDIOGRAM REPORT   Patient Name:   Patricia Atkinson Date of Exam: 03/18/2020 Medical Rec #:  [id number]       Height:       66.0 in Accession #:    [id number]      Weight:       198.6 lb Date of Birth:  Feb 22, 1961       BSA:          1.994 m Patient Age:    59 years        BP:           103/63 mmHg Patient Gender: F               HR:           60 bpm. Exam Location:  Inpatient Procedure: 2D Echo, Cardiac Doppler and Color Doppler Indications:    I48.0 Paroxysmal atrial fibrillation  History:         Patient has no prior history of Echocardiogram examinations.                 COVID-19 Positive.  Sonographer:    Tiffany Dance Referring Phys: [id number] RONDELL A SMITH IMPRESSIONS  1. Left ventricular ejection fraction, by estimation, is 45 to 50%. The left ventricle has mildly decreased function. The left ventricle demonstrates global hypokinesis. The left ventricular internal cavity size was mildly dilated. There is mild concentric left ventricular hypertrophy. Left ventricular diastolic parameters are consistent with Grade III diastolic dysfunction (restrictive). Elevated left atrial pressure.  2. Right ventricular systolic function is mildly reduced. The right ventricular size is normal. There is mildly elevated pulmonary artery systolic pressure. The estimated right ventricular systolic pressure is 38.3 mmHg.  3. Left atrial size was severely dilated.  4. There is leaflet malcoaptation. Effective regurgitant orifica area is 0.4 cm, regurgitant volume 54 mL, regurgitant fraction 60%. The mitral valve appears structurally to be grossly normal. Severe mitral valve regurgitation. No evidence of mitral stenosis.  5. Tricuspid valve regurgitation is moderate.  6. The aortic valve is tricuspid. Aortic valve regurgitation is not visualized. No aortic stenosis is present.  7. The inferior vena cava is normal in size with greater than 50% respiratory variability, suggesting right atrial pressure of 3 mmHg. Conclusion(s)/Recommendation(s): Consider TEE to better evaluate the mechanism and severity of mitral insufficiency. FINDINGS  Left Ventricle: Left ventricular ejection fraction, by estimation, is 45 to 50%. The left ventricle has mildly decreased function. The left ventricle demonstrates global hypokinesis. The left ventricular internal cavity size was mildly dilated. There is  mild concentric left ventricular hypertrophy. Left ventricular diastolic parameters are consistent with Grade III diastolic dysfunction  (restrictive). Elevated left atrial pressure. Right Ventricle: The right ventricular size is normal. Right vetricular wall thickness was not well visualized. Right ventricular systolic function is mildly reduced. There is mildly elevated pulmonary artery systolic pressure. The tricuspid regurgitant velocity is 2.97 m/s, and with an assumed right atrial pressure of 3 mmHg, the estimated right  ventricular systolic pressure is 38.3 mmHg. Left Atrium: Left atrial size was severely dilated. Right Atrium: Right atrial size was normal in size. Pericardium: There is no evidence of pericardial effusion. Mitral Valve: There is leaflet malcoaptation. Effective regurgitant orifice area is 0.4 cm, regurgitant volume 54 mL, regurgitant fraction 60%. The mitral valve is grossly normal. Mild mitral annular calcification. Severe mitral valve regurgitation, with centrally-directed jet. No evidence of mitral valve stenosis. Pulmonary venous flow shows systolic flow reversal. Tricuspid Valve: The tricuspid valve is normal in structure. Tricuspid valve regurgitation is moderate. Aortic Valve: The aortic valve is tricuspid. Aortic valve regurgitation is not visualized. No aortic stenosis is present. Pulmonic Valve: The pulmonic valve was normal in structure. Pulmonic valve regurgitation is trivial. Aorta: The aortic root and ascending aorta are structurally normal, with no evidence of dilitation. Venous: The inferior vena cava is normal in size with greater than 50% respiratory variability, suggesting right atrial pressure of 3 mmHg. IAS/Shunts: No atrial level shunt detected by color flow Doppler.  LEFT VENTRICLE PLAX 2D LVIDd:         5.35 cm      Diastology LVIDs:         3.81 cm      LV e' medial:    4.79 cm/s LV PW:         1.44 cm      LV E/e' medial:  26.1 LV IVS:        1.13 cm      LV e' lateral:   6.85 cm/s LVOT diam:     2.10 cm      LV E/e' lateral: 18.2 LV SV:         36 LV SV Index:   18 LVOT Area:     3.46 cm  LV Volumes  (MOD) LV vol d, MOD A2C: 85.5 ml LV vol d, MOD A4C: 110.6 ml LV vol s, MOD A2C: 50.5 ml LV vol s, MOD A4C: 57.3 ml LV SV MOD A2C:     35.0 ml LV SV MOD A4C:     53.3 ml RIGHT VENTRICLE            IVC RV Basal diam:  3.26 cm    IVC diam: 1.10 cm RV S prime:     6.53 cm/s TAPSE (M-mode): 1.9 cm LEFT ATRIUM             Index       RIGHT ATRIUM           Index LA diam:        5.00 cm 2.51 cm/m  RA Area:     18.90 cm LA Vol (A2C):   74.0 ml 37.11 ml/m RA Volume:   51.30 ml  25.73 ml/m LA Vol (A4C):   93.8 ml 47.04 ml/m LA Biplane Vol: 86.6 ml 43.43 ml/m  AORTIC VALVE LVOT Vmax:   59.80 cm/s LVOT Vmean:  35.700 cm/s LVOT VTI:    0.104 m  AORTA Ao Root diam: 3.10 cm Ao Asc diam:  2.70 cm MITRAL VALVE                 TRICUSPID VALVE MV Area (PHT): 6.53 cm      TR Peak grad:   35.3 mmHg MV Decel Time: 116 msec      TR Vmax:        297.00 cm/s MR Peak grad:    78.9 mmHg MR Mean grad:    56.0 mmHg  SHUNTS MR Vmax:         444.00 cm/s Systemic VTI:  0.10 m MR Vmean:        354.0 cm/s  Systemic Diam: 2.10 cm MR PISA:         4.02 cm MR PISA Eff ROA: 36 mm MR PISA Radius:  0.80 cm MV E velocity: 125.00 cm/s MV A velocity: 40.60 cm/s MV E/A ratio:  3.08 Mihai Croitoru MD Electronically signed by Thurmon Fair MD Signature Date/Time: 03/18/2020/12:40:24 PM    Final     Cardiac Studies   ECHO:  1. Left ventricular ejection fraction, by estimation, is 45 to 50%. The  left ventricle has mildly decreased function. The left ventricle  demonstrates global hypokinesis. The left ventricular internal cavity size  was mildly dilated. There is mild  concentric left ventricular hypertrophy. Left ventricular diastolic  parameters are consistent with Grade III diastolic dysfunction  (restrictive). Elevated left atrial pressure.  2. Right ventricular systolic function is mildly reduced. The right  ventricular size is normal. There is mildly elevated pulmonary artery  systolic pressure. The estimated right ventricular  systolic pressure is  38.3 mmHg.  3. Left atrial size was severely dilated.  4. There is leaflet malcoaptation. Effective regurgitant orifica area is  0.4 cm, regurgitant volume 54 mL, regurgitant fraction 60%. The mitral  valve appears structurally to be grossly normal. Severe mitral valve  regurgitation. No evidence of mitral  stenosis.  5. Tricuspid valve regurgitation is moderate.  6. The aortic valve is tricuspid. Aortic valve regurgitation is not  visualized. No aortic stenosis is present.  7. The inferior vena cava is normal in size with greater than 50%  respiratory variability, suggesting right atrial pressure of 3 mmHg.  Patient Profile     59 y.o. female with a hx of CHF, hypertension, and hyperlipidemia who is being seen for the evaluation of afib and CHF at the request of Dr. Lenell Antu.  Assessment & Plan    ATRIAL FIB:  New diagnosis.  Converted on amiodarone.  Will discontinue amiodarone.  On Heparin.  On DOAC today  ACUTE SYSTOLIC HF:    Given IV diuresis.   Intake and output incomplete.  I will start oral diuretic given her dyspnea prior to admission.    On Coreg.  Off her her home lisinopril.  I will switch to Cozaar and start now.  She likely will need Entresto which can be titrated by her cardiologists in [place].    MR:  She will need TEE and follow up when she moves back to [place].     For questions or updates, please contact [facility] Please consult [email or web address] for contact info under Cardiology/STEMI.   Signed, Rollene Rotunda, MD  03/19/2020, 9:37 AM

## 2020-03-19 NOTE — Progress Notes (Signed)
   ASSESSMENT & PLAN:  Patricia Atkinson is a 59 y.o. female s/p RLE iliofemoral, femoralpopliteal, and tibial thrombectomy. COVID19 infection. AF RVR.   PRN pain control PT / OT / OOB during daylight hours / Ambulate as able Diet as tolerated Continue therapeutic anticoagulation for AF / acute limb ischemia with Eliquis. Appreciate IM / Cards assistance with AF and COVID.  Disposition will be difficult given her difficult to access home (16 steps) and COVID infection. She is nearly medically ready for discharge however.  SUBJECTIVE:  Foot pain resolved. Normal motor / sensory function in right foot.  OBJECTIVE:  BP 117/70   Pulse 71   Temp 97.6 F (36.4 C) (Oral)   Resp 19   Ht 5\' 6"  (1.676 m)   Wt 93.8 kg   SpO2 100%   BMI 33.38 kg/m   Intake/Output Summary (Last 24 hours) at 03/19/2020 1130 Last data filed at 03/19/2020 0100 Gross per 24 hour  Intake 943.03 ml  Output 400 ml  Net 543.03 ml    Constitutional: well appearing. no acute distress. Cardiac: irregular. Vascular: Biphasic DS in peroneal and AT. Groin and medial calf dressings clean and dry. Lateral calf dressing with strikethrough. Pulmonary: unlabored on [place]. Abdominal: soft  CBC Latest Ref Rng & Units 03/19/2020 03/18/2020 03/18/2020  WBC 4.0 - 10.5 K/uL 10.1 7.4 8.1  Hemoglobin 12.0 - 15.0 g/dL 10.8(L) 9.7(L) 10.6(L)  Hematocrit 36 - 46 % 34.1(L) 30.8(L) 32.6(L)  Platelets 150 - 400 K/uL 324 253 297     CMP Latest Ref Rng & Units 03/19/2020 03/18/2020 03/17/2020  Glucose 70 - 99 mg/dL 03/19/2020) 381(O) 175(Z)  BUN 8 - 23 mg/dL 025(E) 52(D) 78(E)  Creatinine 0.44 - 1.00 mg/dL 42(P) 5.36(R) 4.43(X)  Sodium 135 - 145 mmol/L 138 138 135  Potassium 3.5 - 5.1 mmol/L 4.5 4.1 3.7  Chloride 98 - 111 mmol/L 106 106 101  CO2 22 - 32 mmol/L 17(L) 20(L) 21(L)  Calcium 8.9 - 10.3 mg/dL 7.4(L) 7.5(L) 8.4(L)  Total Protein 6.5 - 8.1 g/dL 6.8 6.3(L) -  Total Bilirubin 0.3 - 1.2 mg/dL 5.40(G) 1.2 -  Alkaline Phos 38 -  126 U/L 180(H) 183(H) -  AST 15 - 41 U/L 52(H) 32 -  ALT 0 - 44 U/L 21 19 -    Estimated Creatinine Clearance: 63.7 mL/min (A) (by C-G formula based on SCr of 1.07 mg/dL (H)).  8.6(P. Rande Brunt, MD [facility] Phone Number: [phone] 03/19/2020 11:30 AM

## 2020-03-20 ENCOUNTER — Inpatient Hospital Stay (HOSPITAL_COMMUNITY): Payer: Medicare Other

## 2020-03-20 DIAGNOSIS — I480 Paroxysmal atrial fibrillation: Secondary | ICD-10-CM | POA: Diagnosis not present

## 2020-03-20 DIAGNOSIS — I8290 Acute embolism and thrombosis of unspecified vein: Secondary | ICD-10-CM | POA: Diagnosis not present

## 2020-03-20 LAB — COMPREHENSIVE METABOLIC PANEL
ALT: 24 U/L (ref 0–44)
AST: 47 U/L — ABNORMAL HIGH (ref 15–41)
Albumin: 1.7 g/dL — ABNORMAL LOW (ref 3.5–5.0)
Alkaline Phosphatase: 163 U/L — ABNORMAL HIGH (ref 38–126)
Anion gap: 9 (ref 5–15)
BUN: 29 mg/dL — ABNORMAL HIGH (ref 8–23)
CO2: 21 mmol/L — ABNORMAL LOW (ref 22–32)
Calcium: 7.7 mg/dL — ABNORMAL LOW (ref 8.9–10.3)
Chloride: 109 mmol/L (ref 98–111)
Creatinine, Ser: 0.95 mg/dL (ref 0.44–1.00)
GFR, Estimated: >60 mL/min (ref >60)
Glucose, Bld: 163 mg/dL — ABNORMAL HIGH (ref 70–99)
Potassium: 4.2 mmol/L (ref 3.5–5.1)
Sodium: 139 mmol/L (ref 135–145)
Total Bilirubin: 1 mg/dL (ref 0.3–1.2)
Total Protein: 5.8 g/dL — ABNORMAL LOW (ref 6.5–8.1)

## 2020-03-20 LAB — CBC
HCT: 29.8 % — ABNORMAL LOW (ref 36.0–46.0)
Hemoglobin: 9.6 g/dL — ABNORMAL LOW (ref 12.0–15.0)
MCH: 33.9 pg (ref 26.0–34.0)
MCHC: 32.2 g/dL (ref 30.0–36.0)
MCV: 105.3 fL — ABNORMAL HIGH (ref 80.0–100.0)
Platelets: 310 10*3/uL (ref 150–400)
RBC: 2.83 MIL/uL — ABNORMAL LOW (ref 3.87–5.11)
RDW: 12.2 % (ref 11.5–15.5)
WBC: 9.9 10*3/uL (ref 4.0–10.5)
nRBC: 0 % (ref 0.0–0.2)

## 2020-03-20 LAB — BRAIN NATRIURETIC PEPTIDE: B Natriuretic Peptide: 878 pg/mL — ABNORMAL HIGH (ref 0.0–100.0)

## 2020-03-20 LAB — FERRITIN: Ferritin: 1631 ng/mL — ABNORMAL HIGH (ref 11–307)

## 2020-03-20 LAB — GLUCOSE, CAPILLARY
Glucose-Capillary: 119 mg/dL — ABNORMAL HIGH (ref 70–99)
Glucose-Capillary: 141 mg/dL — ABNORMAL HIGH (ref 70–99)
Glucose-Capillary: 177 mg/dL — ABNORMAL HIGH (ref 70–99)
Glucose-Capillary: 148 mg/dL — ABNORMAL HIGH (ref 70–99)

## 2020-03-20 LAB — D-DIMER, QUANTITATIVE: D-Dimer, Quant: 2.75 ug/mL-FEU — ABNORMAL HIGH (ref 0.00–0.50)

## 2020-03-20 LAB — PHOSPHORUS: Phosphorus: 1.8 mg/dL — ABNORMAL LOW (ref 2.5–4.6)

## 2020-03-20 LAB — MAGNESIUM: Magnesium: 2.2 mg/dL (ref 1.7–2.4)

## 2020-03-20 LAB — C-REACTIVE PROTEIN: CRP: 3.9 mg/dL — ABNORMAL HIGH (ref <1.0)

## 2020-03-20 MED ORDER — METHYLPREDNISOLONE SODIUM SUCC 40 MG IJ SOLR
40.0000 mg | Freq: Every day | INTRAMUSCULAR | Status: DC
  Administered 2020-03-21: 40 mg via INTRAVENOUS
  Filled 2020-03-20: qty 1

## 2020-03-20 MED ORDER — LOSARTAN POTASSIUM 50 MG PO TABS
25.0000 mg | ORAL_TABLET | Freq: Every day | ORAL | Status: DC
  Administered 2020-03-20 – 2020-03-22 (×3): 25 mg via ORAL
  Filled 2020-03-20 (×3): qty 1

## 2020-03-20 MED ORDER — FUROSEMIDE 40 MG PO TABS
40.0000 mg | ORAL_TABLET | Freq: Once | ORAL | Status: AC
  Administered 2020-03-20: 40 mg via ORAL
  Filled 2020-03-20: qty 1

## 2020-03-20 NOTE — Progress Notes (Signed)
Progress Note  Patient Name: Patricia Atkinson Date of Encounter: 03/20/2020  Primary Cardiologist:   No primary care provider on file.   Subjective   No pain.  No SOB.    Inpatient Medications    Scheduled Meds: . albuterol  2 puff Inhalation Q6H  . apixaban  5 mg Oral BID  . vitamin C  500 mg Oral Daily  . aspirin EC  81 mg Oral Q0600  . carvedilol  12.5 mg Oral BID WC  . Chlorhexidine Gluconate Cloth  6 each Topical Daily  . docusate sodium  100 mg Oral Daily  . insulin aspart  0-5 Units Subcutaneous QHS  . insulin aspart  0-9 Units Subcutaneous TID WC  . [START ON 03/21/2020] methylPREDNISolone (SOLU-MEDROL) injection  40 mg Intravenous Daily  . pantoprazole  40 mg Oral Daily  . simvastatin  40 mg Oral q1800  . zinc sulfate  220 mg Oral Daily   Continuous Infusions: . magnesium sulfate bolus IVPB    . remdesivir 100 mg in NS 100 mL 100 mg (03/20/20 0845)   PRN Meds: acetaminophen **OR** acetaminophen, alum & mag hydroxide-simeth, bisacodyl, chlorpheniramine-HYDROcodone, guaiFENesin-dextromethorphan, hydrALAZINE, HYDROmorphone (DILAUDID) injection, labetalol, magnesium sulfate bolus IVPB, metoprolol tartrate, ondansetron, oxyCODONE, phenol, potassium chloride, senna-docusate, sodium phosphate   Vital Signs    Vitals:   03/19/20 2012 03/20/20 0012 03/20/20 0425 03/20/20 0740  BP: 116/64 114/71 128/66 125/70  Pulse: 67 70 72 68  Resp: (!) Temp: 97.9 F (36.6 C) 97.8 F (36.6 C) 97.8 F (36.6 C) 97.9 F (36.6 C)  TempSrc: Oral Oral Oral Oral  SpO2: 93% 97% 96% 95%  Weight:   93.1 kg   Height:        Intake/Output Summary (Last 24 hours) at 03/20/2020 1124 Last data filed at 03/19/2020 1600 Gross per 24 hour  Intake 296.71 ml  Output --  Net 296.71 ml   Filed Weights   03/17/20 1548 03/19/20 0500 03/20/20 0425  Weight: 90.1 kg 93.8 kg 93.1 kg    Telemetry    NSR - Personally Reviewed  ECG    NA - Personally Reviewed  Physical  Exam   GEN: No  acute distress.   Neck: No  JVD Cardiac: RRR, no murmurs, rubs, or gallops.  Respiratory:    Decreased breath sounds with scattered coarse crackles.  GI: Soft, nontender, non-distended, normal bowel sounds  MS:  Mild right leg edema; No deformity. Neuro:   Nonfocal  Psych: Oriented and appropriate    Labs    Chemistry Recent Labs  Lab 03/18/20 0122 03/19/20 0323 03/20/20 0138  NA 138 138 139  K 4.1 4.5 4.2  CL 106 106 109  CO2 20* 17* 21*  GLUCOSE 197* 173* 163*  BUN 31* 32* 29*  CREATININE 1.03* 1.07* 0.95  CALCIUM 7.5* 7.4* 7.7*  PROT 6.3* 6.8 5.8*  ALBUMIN 2.0* 2.1* 1.7*  AST 32 52* 47*  ALT ALKPHOS 183* 180* 163*  BILITOT 1.2 1.3* 1.0  GFRNONAA >60 59* >60  ANIONGAP Hematology Recent Labs  Lab 03/18/20 0954 03/19/20 0323 03/20/20 0138  WBC 7.4 10.1 9.9  RBC 2.88* 3.21* 2.83*  HGB 9.7* 10.8* 9.6*  HCT 30.8* 34.1* 29.8*  MCV 106.9* 106.2* 105.3*  MCH 33.7 33.6 33.9  MCHC 31.5 31.7 32.2  RDW 12.3 12.3 12.2  PLT 253 324 310    Cardiac EnzymesNo results for input(s): TROPONINI in  the last 168 hours. No results for input(s): TROPIPOC in the last 168 hours.   BNP Recent Labs  Lab 03/17/20 0311 03/20/20 0138  BNP 235.9* 878.0*     DDimer  Recent Labs  Lab 03/18/20 0122 03/19/20 0323 03/20/20 0138  DDIMER 9.31* 3.25* 2.75*     Radiology    DG Chest 1 View  Result Date: 03/19/2020 CLINICAL DATA:  Shortness of breath. EXAM: CHEST  1 VIEW COMPARISON:  None. FINDINGS: Persistent mild to moderate severity increased interstitial prominence is noted. Mild areas of atelectasis and/or infiltrate are also seen within the bilateral lung bases left greater than right. This is increased in severity within the left lung base when compared to the prior study. There is no evidence of a pleural effusion or pneumothorax. The cardiac silhouette is moderately enlarged. The visualized skeletal structures are unremarkable.  IMPRESSION: Persistent pulmonary edema with mild bibasilar atelectasis and/or infiltrate, left greater than right. Electronically Signed   By: Aram Candela M.D.   On: 03/19/2020 15:11   DG Chest Port 1 View  Result Date: 03/20/2020 CLINICAL DATA:  COVID-19 positive with previously documented pneumonia EXAM: PORTABLE CHEST 1 VIEW COMPARISON:  March 19, 2020. FINDINGS: Patchy airspace opacity is seen throughout the lungs bilaterally, most notably in the bases, essentially stable. No appreciable consolidation. Heart is enlarged, stable, with pulmonary vascularity normal. No adenopathy. No bone lesions. IMPRESSION: Multifocal airspace opacity consistent with multifocal atypical organism pneumonia, similar to 1 day prior. Stable cardiac prominence. No adenopathy evident. Electronically Signed   By: Bretta Bang III M.D.   On: 03/20/2020 08:46   ECHOCARDIOGRAM COMPLETE  Result Date: 03/18/2020    ECHOCARDIOGRAM REPORT   Patient Name:   Patricia Atkinson Date of Exam: 03/18/2020 Medical Rec #:  [id number]       Height:       66.0 in Accession #:    [id number]      Weight:       198.6 lb Date of Birth:  02-27-1961       BSA:          1.994 m Patient Age:    59 years        BP:           103/63 mmHg Patient Gender: F               HR:           60 bpm. Exam Location:  Inpatient Procedure: 2D Echo, Cardiac Doppler and Color Doppler Indications:    I48.0 Paroxysmal atrial fibrillation  History:        Patient has no prior history of Echocardiogram examinations.                 COVID-19 Positive.  Sonographer:    Tiffany Dance Referring Phys: [id number] RONDELL A SMITH IMPRESSIONS  1. Left ventricular ejection fraction, by estimation, is 45 to 50%. The left ventricle has mildly decreased function. The left ventricle demonstrates global hypokinesis. The left ventricular internal cavity size was mildly dilated. There is mild concentric left ventricular hypertrophy. Left ventricular diastolic parameters are  consistent with Grade III diastolic dysfunction (restrictive). Elevated left atrial pressure.  2. Right ventricular systolic function is mildly reduced. The right ventricular size is normal. There is mildly elevated pulmonary artery systolic pressure. The estimated right ventricular systolic pressure is 38.3 mmHg.  3. Left atrial size was severely dilated.  4. There is leaflet malcoaptation. Effective regurgitant orifica area is 0.4 cm,  regurgitant volume 54 mL, regurgitant fraction 60%. The mitral valve appears structurally to be grossly normal. Severe mitral valve regurgitation. No evidence of mitral stenosis.  5. Tricuspid valve regurgitation is moderate.  6. The aortic valve is tricuspid. Aortic valve regurgitation is not visualized. No aortic stenosis is present.  7. The inferior vena cava is normal in size with greater than 50% respiratory variability, suggesting right atrial pressure of 3 mmHg. Conclusion(s)/Recommendation(s): Consider TEE to better evaluate the mechanism and severity of mitral insufficiency. FINDINGS  Left Ventricle: Left ventricular ejection fraction, by estimation, is 45 to 50%. The left ventricle has mildly decreased function. The left ventricle demonstrates global hypokinesis. The left ventricular internal cavity size was mildly dilated. There is  mild concentric left ventricular hypertrophy. Left ventricular diastolic parameters are consistent with Grade III diastolic dysfunction (restrictive). Elevated left atrial pressure. Right Ventricle: The right ventricular size is normal. Right vetricular wall thickness was not well visualized. Right ventricular systolic function is mildly reduced. There is mildly elevated pulmonary artery systolic pressure. The tricuspid regurgitant velocity is 2.97 m/s, and with an assumed right atrial pressure of 3 mmHg, the estimated right ventricular systolic pressure is 38.3 mmHg. Left Atrium: Left atrial size was severely dilated. Right Atrium: Right  atrial size was normal in size. Pericardium: There is no evidence of pericardial effusion. Mitral Valve: There is leaflet malcoaptation. Effective regurgitant orifice area is 0.4 cm, regurgitant volume 54 mL, regurgitant fraction 60%. The mitral valve is grossly normal. Mild mitral annular calcification. Severe mitral valve regurgitation, with centrally-directed jet. No evidence of mitral valve stenosis. Pulmonary venous flow shows systolic flow reversal. Tricuspid Valve: The tricuspid valve is normal in structure. Tricuspid valve regurgitation is moderate. Aortic Valve: The aortic valve is tricuspid. Aortic valve regurgitation is not visualized. No aortic stenosis is present. Pulmonic Valve: The pulmonic valve was normal in structure. Pulmonic valve regurgitation is trivial. Aorta: The aortic root and ascending aorta are structurally normal, with no evidence of dilitation. Venous: The inferior vena cava is normal in size with greater than 50% respiratory variability, suggesting right atrial pressure of 3 mmHg. IAS/Shunts: No atrial level shunt detected by color flow Doppler.  LEFT VENTRICLE PLAX 2D LVIDd:         5.35 cm      Diastology LVIDs:         3.81 cm      LV e' medial:    4.79 cm/s LV PW:         1.44 cm      LV E/e' medial:  26.1 LV IVS:        1.13 cm      LV e' lateral:   6.85 cm/s LVOT diam:     2.10 cm      LV E/e' lateral: 18.2 LV SV:         36 LV SV Index:   18 LVOT Area:     3.46 cm  LV Volumes (MOD) LV vol d, MOD A2C: 85.5 ml LV vol d, MOD A4C: 110.6 ml LV vol s, MOD A2C: 50.5 ml LV vol s, MOD A4C: 57.3 ml LV SV MOD A2C:     35.0 ml LV SV MOD A4C:     53.3 ml RIGHT VENTRICLE            IVC RV Basal diam:  3.26 cm    IVC diam: 1.10 cm RV S prime:     6.53 cm/s TAPSE (M-mode): 1.9 cm LEFT ATRIUM  Index       RIGHT ATRIUM           Index LA diam:        5.00 cm 2.51 cm/m  RA Area:     18.90 cm LA Vol (A2C):   74.0 ml 37.11 ml/m RA Volume:   51.30 ml  25.73 ml/m LA Vol (A4C):   93.8  ml 47.04 ml/m LA Biplane Vol: 86.6 ml 43.43 ml/m  AORTIC VALVE LVOT Vmax:   59.80 cm/s LVOT Vmean:  35.700 cm/s LVOT VTI:    0.104 m  AORTA Ao Root diam: 3.10 cm Ao Asc diam:  2.70 cm MITRAL VALVE                 TRICUSPID VALVE MV Area (PHT): 6.53 cm      TR Peak grad:   35.3 mmHg MV Decel Time: 116 msec      TR Vmax:        297.00 cm/s MR Peak grad:    78.9 mmHg MR Mean grad:    56.0 mmHg   SHUNTS MR Vmax:         444.00 cm/s Systemic VTI:  0.10 m MR Vmean:        354.0 cm/s  Systemic Diam: 2.10 cm MR PISA:         4.02 cm MR PISA Eff ROA: 36 mm MR PISA Radius:  0.80 cm MV E velocity: 125.00 cm/s MV A velocity: 40.60 cm/s MV E/A ratio:  3.08 Mihai Croitoru MD Electronically signed by Thurmon Fair MD Signature Date/Time: 03/18/2020/12:40:24 PM    Final    VAS [place] LOWER EXTREMITY VENOUS (DVT)  Result Date: 03/19/2020  Lower Venous DVT Study Indications: Covid-19, elevated D-Dimer, Atrial fibrillation  Risk Factors: S/p RLE iliofemoral, femoral, popliteal, and tibial thrombectomy, 03/17/20 Anticoagulation: Eliquis. Limitations: Body habitus and bandages. Comparison Study: No prior study Performing Technologist: Sherren Kerns RVS  Examination Guidelines: A complete evaluation includes B-mode imaging, spectral Doppler, color Doppler, and power Doppler as needed of all accessible portions of each vessel. Bilateral testing is considered an integral part of a complete examination. Limited examinations for reoccurring indications may be performed as noted. The reflux portion of the exam is performed with the patient in reverse Trendelenburg.  +---------+---------------+---------+-----------+----------+------------------+ RIGHT    CompressibilityPhasicitySpontaneityPropertiesThrombus Aging     +---------+---------------+---------+-----------+----------+------------------+ CFV      Full                                                             +---------+---------------+---------+-----------+----------+------------------+ SFJ      Full                                                            +---------+---------------+---------+-----------+----------+------------------+ FV Prox  Full                                                            +---------+---------------+---------+-----------+----------+------------------+  FV Mid   Full                                                            +---------+---------------+---------+-----------+----------+------------------+ FV DistalFull                                                            +---------+---------------+---------+-----------+----------+------------------+ PFV      Full                                                            +---------+---------------+---------+-----------+----------+------------------+ POP      Partial        No       No                   Acute, non                                                               occlusive          +---------+---------------+---------+-----------+----------+------------------+ PTV      Full                                                            +---------+---------------+---------+-----------+----------+------------------+ PERO                                                  Not well                                                                 visualized         +---------+---------------+---------+-----------+----------+------------------+ Gastroc  Partial        No       No                   Acute, non  occlusive          +---------+---------------+---------+-----------+----------+------------------+   +---------+---------------+---------+-----------+----------+-------------------+ LEFT     CompressibilityPhasicitySpontaneityPropertiesThrombus Aging       +---------+---------------+---------+-----------+----------+-------------------+ CFV      Full           Yes      Yes                  sluggish flow noted +---------+---------------+---------+-----------+----------+-------------------+ SFJ      Full                                                             +---------+---------------+---------+-----------+----------+-------------------+ FV Prox  Full                                                             +---------+---------------+---------+-----------+----------+-------------------+ FV Mid   Full                                                             +---------+---------------+---------+-----------+----------+-------------------+ FV DistalFull                                                             +---------+---------------+---------+-----------+----------+-------------------+ PFV      Full                                                             +---------+---------------+---------+-----------+----------+-------------------+ POP      Full                                                             +---------+---------------+---------+-----------+----------+-------------------+ PTV      Full                                                             +---------+---------------+---------+-----------+----------+-------------------+ PERO                                                  Not well visualized +---------+---------------+---------+-----------+----------+-------------------+ Gastroc  Full                                                             +---------+---------------+---------+-----------+----------+-------------------+  Summary: RIGHT: - Findings consistent with acute deep vein thrombosis involving the right popliteal vein, and right gastrocnemius veins.  LEFT: - There is no evidence of deep vein thrombosis in the lower extremity. However, portions of this  examination were limited- see technologist comments above.  *See table(s) above for measurements and observations. Electronically signed by Heath Larkhomas Hawken on 03/19/2020 at 1:54:56 PM.    Final     Cardiac Studies   ECHO:  1. Left ventricular ejection fraction, by estimation, is 45 to 50%. The  left ventricle has mildly decreased function. The left ventricle  demonstrates global hypokinesis. The left ventricular internal cavity size  was mildly dilated. There is mild  concentric left ventricular hypertrophy. Left ventricular diastolic  parameters are consistent with Grade III diastolic dysfunction  (restrictive). Elevated left atrial pressure.  2. Right ventricular systolic function is mildly reduced. The right  ventricular size is normal. There is mildly elevated pulmonary artery  systolic pressure. The estimated right ventricular systolic pressure is  38.3 mmHg.  3. Left atrial size was severely dilated.  4. There is leaflet malcoaptation. Effective regurgitant orifica area is  0.4 cm, regurgitant volume 54 mL, regurgitant fraction 60%. The mitral  valve appears structurally to be grossly normal. Severe mitral valve  regurgitation. No evidence of mitral  stenosis.  5. Tricuspid valve regurgitation is moderate.  6. The aortic valve is tricuspid. Aortic valve regurgitation is not  visualized. No aortic stenosis is present.  7. The inferior vena cava is normal in size with greater than 50%  respiratory variability, suggesting right atrial pressure of 3 mmHg.  Patient Profile     59 y.o. female with a hx of CHF, hypertension, and hyperlipidemia who is being seen for the evaluation of afib and CHF at the request of Dr. Lenell AntuHawken.  Assessment & Plan    ATRIAL FIB:  New diagnosis.  Converted on amiodarone.  Amiodarone briefly.  Discontinued.  On Eliuqis.    ACUTE SYSTOLIC HF:   Given oral Lasix today.  Starting Cozaar.  Likely no further med titration this admission.  She can  follow with us until she goes to [place].  I will put in for a four week follow up in our office.    MR:  She will need TEE and follow up when she moves back to [place].     For questions or updates, please contact [facility] Please consult [email or web address] for contact info under Cardiology/STEMI.   Signed, Rollene RotundaJames Candiace West, MD  03/20/2020, 11:24 AM

## 2020-03-20 NOTE — Progress Notes (Signed)
PROGRESS NOTE    Patricia Atkinson  ZOX:[id number] DOB: Jan 20, 1961 DOA: 03/17/2020 PCP: Patient, No Pcp Per   Brief Narrative:  59 y.o. female with medical history significant of hypertension hyperlipidemia who presents with complaints of right leg pain which started yesterday afternoon.  Pain was described as sharp and severe.  Denied having any injury to onset of pain.  Associated symptoms include mild cough, mild short of breath, and diarrhea which started attending a family member's funeral 1 week ago.  Patient notes that she was not vaccinated against COVID-19.  Denies having any significant vomiting or change in smell/taste.  She had gone to the emergency department and was evaluated and found to be in atrial fibrillation.  She was found to have no dopplerable pulses of the right leg and CT revealed an occlusive filling defect of the right common iliac extending into the external and internal iliac arteries and occlusion of the distal popliteal artery.  She was given heparin bolus and started on heparin drip.  Vascular surgery admitted the patient for open thrombectomy. Chest x-ray was concerning for mild pulmonary edema and cardiomegaly. COVID-19 screening was positive.  Labs revealed WBC 8.3, hemoglobin 15, platelets 409, BUN 51, creatinine 1.4, lactic acid 2.7, and D-dimer 18.06.  Assessment & Plan:    #1 Acute hypoxic respiratory failure likely multifactorial from Covid pneumonia versus acute CHF versus could she have a PE.  Patient admitted with shortness of breath and cough.  She was found to be COVID-19 positive, underlying CHF -she has been treated with IV steroids and Remdesivir for COVID-19 pneumonia along with as needed Lasix.  Hypoxia is improved.  Advance activity, encouraged to sit up in chair in daytime, use I-S and flutter valve in daytime for pulmonary toiletry.  Gently titrate off oxygen as tolerated.      #2 acute combined CHF exacerbation with EF 45%.  Cardiology on board.  Echo  with ejection fraction 45 to 50%.  Left ventricle demonstrates global hypokinesis.  Grade 3 diastolic dysfunction.  Now as needed Lasix.  Monitor  #3 new onset atrial fibrillation CHA2DS2-VASc score is 5.  Patient has converted to normal sinus rhythm amiodarone stopped by cardiology.  Continue Eliquis, dose adjusted by vascular surgery, discussed with pharmacist by me personally on 03/20/2020.    #4 Covid positive with chest x-ray infiltrates versus fluid overload with elevated D-dimer.  Continue remdesivir steroids and supportive treatment.  Encourage incentive spirometry flutter valve out of bed as much as tolerated. D-dimer 3.25 from 18.06. CRP 8.6 from 18.7.  #5 possible AKI versus CKD unknown baseline, creatinine 1.07 down from 1.40.  #6 history of essential hypertension - blood pressure soft on Coreg.    #5 Right common iliac artery acute thrombus long with right lower extremity DVT.-started Eliquis by vascular 03/19/2020.  Right lower extremity ischemia.  She is status post iliofemoral thrombectomy femoropopliteal thrombectomy and anterior tibial and peroneal thrombectomy and 4 compartment calf fasciotomies of the right lower extremity.  Eliquis dose being monitored by vascular surgery per pharmacist.  Discussed with pharmacist on 03/20/2020.  #6 hypercoagulable state likely induced by Covid with arterial and venous thrombosis on Eliquis.  Outpatient hematology follow-up.  #7 Right lower extremity acute DVT-on Eliquis per vascular surgery    Estimated body mass index is 33.13 kg/m as calculated from the following:   Height as of this encounter:  (1.676 m).   Weight as of this encounter: 93.1 kg.  DVT prophylaxis: eliquis  code Status: Full code  Family Communication: None at bedside  Subjective:  Patient in bed, appears comfortable, denies any headache, no fever, no chest pain or pressure, no shortness of breath , no abdominal pain. No focal weakness.   Objective: Vitals:    03/19/20 2012 03/20/20 0012 03/20/20 0425 03/20/20 0740  BP: 116/64 114/71 128/66 125/70  Pulse: 67 70 72 68  Resp: (!) 22 19 20 15   Temp: 97.9 F (36.6 C) 97.8 F (36.6 C) 97.8 F (36.6 C) 97.9 F (36.6 C)  TempSrc: Oral Oral Oral Oral  SpO2: 93% 97% 96% 95%  Weight:   93.1 kg   Height:        Intake/Output Summary (Last 24 hours) at 03/20/2020 0947 Last data filed at 03/19/2020 1600 Gross per 24 hour  Intake 296.71 ml  Output --  Net 296.71 ml   Filed Weights   03/17/20 1548 03/19/20 0500 03/20/20 0425  Weight: 90.1 kg 93.8 kg 93.1 kg    Examination:  Awake Alert, No new F.N deficits, Normal affect [place].AT,PERRAL Supple Neck,No JVD, No cervical lymphadenopathy appriciated.  Symmetrical Chest wall movement, Good air movement bilaterally, CTAB RRR,No Gallops, Rubs or new Murmurs, No Parasternal Heave +ve B.Sounds, Abd Soft, No tenderness, No organomegaly appriciated, No rebound - guarding or rigidity. No Cyanosis, right lower extremity, mid leg medial side incision site clean with staples on it,     Data Reviewed: I have personally reviewed following labs and imaging studies  CBC: Recent Labs  Lab 03/17/20 0311 03/18/20 0122 03/18/20 0954 03/19/20 0323 03/20/20 0138  WBC 8.3 8.1 7.4 10.1 9.9  NEUTROABS 6.5  --   --   --   --   HGB 15.0 10.6* 9.7* 10.8* 9.6*  HCT 45.6 32.6* 30.8* 34.1* 29.8*  MCV 102.9* 105.2* 106.9* 106.2* 105.3*  PLT 409* 297 253 324 310   Basic Metabolic Panel: Recent Labs  Lab 03/17/20 0311 03/18/20 0122 03/19/20 0323 03/20/20 0138  NA 135 138 138 139  K 3.7 4.1 4.5 4.2  CL 101 106 106 109  CO2 21* 20* 17* 21*  GLUCOSE 157* 197* 173* 163*  BUN 51* 31* 32* 29*  CREATININE 1.40* 1.03* 1.07* 0.95  CALCIUM 8.4* 7.5* 7.4* 7.7*  MG  --  2.2 2.1 2.2  PHOS  --  3.6 2.7 1.8*   GFR: Estimated Creatinine Clearance: 71.5 mL/min (by C-G formula based on SCr of 0.95 mg/dL). Liver Function Tests: Recent Labs  Lab 03/18/20 0122  03/19/20 0323 03/20/20 0138  AST 32 52* 47*  ALT 19 21 24   ALKPHOS 183* 180* 163*  BILITOT 1.2 1.3* 1.0  PROT 6.3* 6.8 5.8*  ALBUMIN 2.0* 2.1* 1.7*   No results for input(s): LIPASE, AMYLASE in the last 168 hours. No results for input(s): AMMONIA in the last 168 hours. Coagulation Profile: Recent Labs  Lab 03/17/20 0311  INR 1.3*   Cardiac Enzymes: Recent Labs  Lab 03/17/20 0311  CKTOTAL 41   BNP (last 3 results) No results for input(s): PROBNP in the last 8760 hours. HbA1C: Recent Labs    03/18/20 0122 03/18/20 0954  HGBA1C 5.9* 6.0*   CBG: Recent Labs  Lab 03/19/20 0741 03/19/20 1146 03/19/20 1705 03/19/20 2056 03/20/20 0743  GLUCAP 160* 175* 142* 160* 119*   Lipid Profile: Recent Labs    03/18/20 0122  CHOL 168  HDL 17*  LDLCALC 121*  TRIG 149  CHOLHDL 9.9   Thyroid Function Tests: Recent Labs    03/19/20 0323  TSH 1.942  Anemia Panel: Recent Labs    03/19/20 0323 03/20/20 0138  FERRITIN 2,218* 1,631*   Sepsis Labs: Recent Labs  Lab 03/17/20 0311 03/17/20 1856  PROCALCITON  --  0.54  LATICACIDVEN 2.7*  --     Recent Results (from the past 240 hour(s))  Resp Panel by RT-PCR (Flu A&B, Covid) Nasopharyngeal Swab     Status: Abnormal   Collection Time: 03/17/20  7:12 AM   Specimen: Nasopharyngeal Swab; Nasopharyngeal(NP) swabs in vial transport medium  Result Value Ref Range Status   SARS Coronavirus 2 by RT PCR POSITIVE (A) NEGATIVE Final    Comment: RESULT CALLED TO, READ BACK BY AND VERIFIED WITH: HAVILAND,J DR  ON 03/17/20 JACKSON,K (NOTE) SARS-CoV-2 target nucleic acids are DETECTED.  The SARS-CoV-2 RNA is generally detectable in upper respiratory specimens during the acute phase of infection. Positive results are indicative of the presence of the identified virus, but do not rule out bacterial infection or co-infection with other pathogens not detected by the test. Clinical correlation with patient history and other  diagnostic information is necessary to determine patient infection status. The expected result is Negative.  Fact Sheet for Patients: BloggerCourse.com  Fact Sheet for Healthcare Providers: SeriousBroker.it  This test is not yet approved or cleared by the [place] FDA and  has been authorized for detection and/or diagnosis of SARS-CoV-2 by FDA under an Emergency Use Authorization (EUA).  This EUA will remain in effect (meaning this tes t can be used) for the duration of  the COVID-19 declaration under Section 564(b)(1) of the Act, 21 U.S.C. section 360bbb-3(b)(1), unless the authorization is terminated or revoked sooner.     Influenza A by PCR NEGATIVE NEGATIVE Final   Influenza B by PCR NEGATIVE NEGATIVE Final    Comment: (NOTE) The Xpert Xpress SARS-CoV-2/FLU/RSV plus assay is intended as an aid in the diagnosis of influenza from Nasopharyngeal swab specimens and should not be used as a sole basis for treatment. Nasal washings and aspirates are unacceptable for Xpert Xpress SARS-CoV-2/FLU/RSV testing.  Fact Sheet for Patients: BloggerCourse.com  Fact Sheet for Healthcare Providers: SeriousBroker.it  This test is not yet approved or cleared by the [place] FDA and has been authorized for detection and/or diagnosis of SARS-CoV-2 by FDA under an Emergency Use Authorization (EUA). This EUA will remain in effect (meaning this test can be used) for the duration of the COVID-19 declaration under Section 564(b)(1) of the Act, 21 U.S.C. section 360bbb-3(b)(1), unless the authorization is terminated or revoked.  Performed at [facility], [address], [place], [place] [address]   Culture, Urine     Status: None   Collection Time: 03/18/20  6:15 PM   Specimen: Urine, Clean Catch  Result Value Ref Range Status   Specimen Description URINE, CLEAN CATCH   Final   Special Requests NONE  Final   Culture   Final    NO GROWTH Performed at [facility], [address], [place], [place] [address]    Report Status 03/19/2020 FINAL  Final         Radiology Studies: DG Chest 1 View  Result Date: 03/19/2020 CLINICAL DATA:  Shortness of breath. EXAM: CHEST  1 VIEW COMPARISON:  None. FINDINGS: Persistent mild to moderate severity increased interstitial prominence is noted. Mild areas of atelectasis and/or infiltrate are also seen within the bilateral lung bases left greater than right. This is increased in severity within the left lung base when compared to the prior study.  There is no evidence of a pleural effusion or pneumothorax. The cardiac silhouette is moderately enlarged. The visualized skeletal structures are unremarkable. IMPRESSION: Persistent pulmonary edema with mild bibasilar atelectasis and/or infiltrate, left greater than right. Electronically Signed   By: Aram Candela M.D.   On: 03/19/2020 15:11   DG Chest Port 1 View  Result Date: 03/20/2020 CLINICAL DATA:  COVID-19 positive with previously documented pneumonia EXAM: PORTABLE CHEST 1 VIEW COMPARISON:  March 19, 2020. FINDINGS: Patchy airspace opacity is seen throughout the lungs bilaterally, most notably in the bases, essentially stable. No appreciable consolidation. Heart is enlarged, stable, with pulmonary vascularity normal. No adenopathy. No bone lesions. IMPRESSION: Multifocal airspace opacity consistent with multifocal atypical organism pneumonia, similar to 1 day prior. Stable cardiac prominence. No adenopathy evident. Electronically Signed   By: Bretta Bang III M.D.   On: 03/20/2020 08:46   ECHOCARDIOGRAM COMPLETE  Result Date: 03/18/2020    ECHOCARDIOGRAM REPORT   Patient Name:   Patricia Atkinson Date of Exam: 03/18/2020 Medical Rec #:  [id number]       Height:       66.0 in Accession #:    [id number]      Weight:       198.6 lb Date of Birth:  [birth year]        BSA:          1.994 m Patient Age:    59 years        BP:           103/63 mmHg Patient Gender: F               HR:           60 bpm. Exam Location:  Inpatient Procedure: 2D Echo, Cardiac Doppler and Color Doppler Indications:    I48.0 Paroxysmal atrial fibrillation  History:        Patient has no prior history of Echocardiogram examinations.                 COVID-19 Positive.  Sonographer:    Tiffany Dance Referring Phys: [id number] RONDELL A SMITH IMPRESSIONS  1. Left ventricular ejection fraction, by estimation, is 45 to 50%. The left ventricle has mildly decreased function. The left ventricle demonstrates global hypokinesis. The left ventricular internal cavity size was mildly dilated. There is mild concentric left ventricular hypertrophy. Left ventricular diastolic parameters are consistent with Grade III diastolic dysfunction (restrictive). Elevated left atrial pressure.  2. Right ventricular systolic function is mildly reduced. The right ventricular size is normal. There is mildly elevated pulmonary artery systolic pressure. The estimated right ventricular systolic pressure is 38.3 mmHg.  3. Left atrial size was severely dilated.  4. There is leaflet malcoaptation. Effective regurgitant orifica area is 0.4 cm, regurgitant volume 54 mL, regurgitant fraction 60%. The mitral valve appears structurally to be grossly normal. Severe mitral valve regurgitation. No evidence of mitral stenosis.  5. Tricuspid valve regurgitation is moderate.  6. The aortic valve is tricuspid. Aortic valve regurgitation is not visualized. No aortic stenosis is present.  7. The inferior vena cava is normal in size with greater than 50% respiratory variability, suggesting right atrial pressure of 3 mmHg. Conclusion(s)/Recommendation(s): Consider TEE to better evaluate the mechanism and severity of mitral insufficiency. FINDINGS  Left Ventricle: Left ventricular ejection fraction, by estimation, is 45 to 50%. The left ventricle has mildly  decreased function. The left ventricle demonstrates global hypokinesis. The left ventricular internal cavity size was mildly dilated. There is  mild concentric left ventricular hypertrophy. Left ventricular diastolic parameters are consistent with Grade III diastolic dysfunction (restrictive). Elevated left atrial pressure. Right Ventricle: The right ventricular size is normal. Right vetricular wall thickness was not well visualized. Right ventricular systolic function is mildly reduced. There is mildly elevated pulmonary artery systolic pressure. The tricuspid regurgitant velocity is 2.97 m/s, and with an assumed right atrial pressure of 3 mmHg, the estimated right ventricular systolic pressure is 38.3 mmHg. Left Atrium: Left atrial size was severely dilated. Right Atrium: Right atrial size was normal in size. Pericardium: There is no evidence of pericardial effusion. Mitral Valve: There is leaflet malcoaptation. Effective regurgitant orifice area is 0.4 cm, regurgitant volume 54 mL, regurgitant fraction 60%. The mitral valve is grossly normal. Mild mitral annular calcification. Severe mitral valve regurgitation, with centrally-directed jet. No evidence of mitral valve stenosis. Pulmonary venous flow shows systolic flow reversal. Tricuspid Valve: The tricuspid valve is normal in structure. Tricuspid valve regurgitation is moderate. Aortic Valve: The aortic valve is tricuspid. Aortic valve regurgitation is not visualized. No aortic stenosis is present. Pulmonic Valve: The pulmonic valve was normal in structure. Pulmonic valve regurgitation is trivial. Aorta: The aortic root and ascending aorta are structurally normal, with no evidence of dilitation. Venous: The inferior vena cava is normal in size with greater than 50% respiratory variability, suggesting right atrial pressure of 3 mmHg. IAS/Shunts: No atrial level shunt detected by color flow Doppler.  LEFT VENTRICLE PLAX 2D LVIDd:         5.35 cm      Diastology  LVIDs:         3.81 cm      LV e' medial:    4.79 cm/s LV PW:         1.44 cm      LV E/e' medial:  26.1 LV IVS:        1.13 cm      LV e' lateral:   6.85 cm/s LVOT diam:     2.10 cm      LV E/e' lateral: 18.2 LV SV:         36 LV SV Index:   18 LVOT Area:     3.46 cm  LV Volumes (MOD) LV vol d, MOD A2C: 85.5 ml LV vol d, MOD A4C: 110.6 ml LV vol s, MOD A2C: 50.5 ml LV vol s, MOD A4C: 57.3 ml LV SV MOD A2C:     35.0 ml LV SV MOD A4C:     53.3 ml RIGHT VENTRICLE            IVC RV Basal diam:  3.26 cm    IVC diam: 1.10 cm RV S prime:     6.53 cm/s TAPSE (M-mode): 1.9 cm LEFT ATRIUM             Index       RIGHT ATRIUM           Index LA diam:        5.00 cm 2.51 cm/m  RA Area:     18.90 cm LA Vol (A2C):   74.0 ml 37.11 ml/m RA Volume:   51.30 ml  25.73 ml/m LA Vol (A4C):   93.8 ml 47.04 ml/m LA Biplane Vol: 86.6 ml 43.43 ml/m  AORTIC VALVE LVOT Vmax:   59.80 cm/s LVOT Vmean:  35.700 cm/s LVOT VTI:    0.104 m  AORTA Ao Root diam: 3.10 cm Ao Asc diam:  2.70 cm MITRAL VALVE  TRICUSPID VALVE MV Area (PHT): 6.53 cm      TR Peak grad:   35.3 mmHg MV Decel Time: 116 msec      TR Vmax:        297.00 cm/s MR Peak grad:    78.9 mmHg MR Mean grad:    56.0 mmHg   SHUNTS MR Vmax:         444.00 cm/s Systemic VTI:  0.10 m MR Vmean:        354.0 cm/s  Systemic Diam: 2.10 cm MR PISA:         4.02 cm MR PISA Eff ROA: 36 mm MR PISA Radius:  0.80 cm MV E velocity: 125.00 cm/s MV A velocity: 40.60 cm/s MV E/A ratio:  3.08 Mihai Croitoru MD Electronically signed by Thurmon Fair MD Signature Date/Time: 03/18/2020/12:40:24 PM    Final    VAS [place] LOWER EXTREMITY VENOUS (DVT)  Result Date: 03/19/2020  Lower Venous DVT Study Indications: Covid-19, elevated D-Dimer, Atrial fibrillation  Risk Factors: S/p RLE iliofemoral, femoral, popliteal, and tibial thrombectomy, 03/17/20 Anticoagulation: Eliquis. Limitations: Body habitus and bandages. Comparison Study: No prior study Performing Technologist: Sherren Kerns RVS   Examination Guidelines: A complete evaluation includes B-mode imaging, spectral Doppler, color Doppler, and power Doppler as needed of all accessible portions of each vessel. Bilateral testing is considered an integral part of a complete examination. Limited examinations for reoccurring indications may be performed as noted. The reflux portion of the exam is performed with the patient in reverse Trendelenburg.  +---------+---------------+---------+-----------+----------+------------------+ RIGHT    CompressibilityPhasicitySpontaneityPropertiesThrombus Aging     +---------+---------------+---------+-----------+----------+------------------+ CFV      Full                                                            +---------+---------------+---------+-----------+----------+------------------+ SFJ      Full                                                            +---------+---------------+---------+-----------+----------+------------------+ FV Prox  Full                                                            +---------+---------------+---------+-----------+----------+------------------+ FV Mid   Full                                                            +---------+---------------+---------+-----------+----------+------------------+ FV DistalFull                                                            +---------+---------------+---------+-----------+----------+------------------+  PFV      Full                                                            +---------+---------------+---------+-----------+----------+------------------+ POP      Partial        No       No                   Acute, non                                                               occlusive          +---------+---------------+---------+-----------+----------+------------------+ PTV      Full                                                             +---------+---------------+---------+-----------+----------+------------------+ PERO                                                  Not well                                                                 visualized         +---------+---------------+---------+-----------+----------+------------------+ Gastroc  Partial        No       No                   Acute, non                                                               occlusive          +---------+---------------+---------+-----------+----------+------------------+   +---------+---------------+---------+-----------+----------+-------------------+ LEFT     CompressibilityPhasicitySpontaneityPropertiesThrombus Aging      +---------+---------------+---------+-----------+----------+-------------------+ CFV      Full           Yes      Yes                  sluggish flow noted +---------+---------------+---------+-----------+----------+-------------------+ SFJ      Full                                                             +---------+---------------+---------+-----------+----------+-------------------+  FV Prox  Full                                                             +---------+---------------+---------+-----------+----------+-------------------+ FV Mid   Full                                                             +---------+---------------+---------+-----------+----------+-------------------+ FV DistalFull                                                             +---------+---------------+---------+-----------+----------+-------------------+ PFV      Full                                                             +---------+---------------+---------+-----------+----------+-------------------+ POP      Full                                                             +---------+---------------+---------+-----------+----------+-------------------+ PTV       Full                                                             +---------+---------------+---------+-----------+----------+-------------------+ PERO                                                  Not well visualized +---------+---------------+---------+-----------+----------+-------------------+ Gastroc  Full                                                             +---------+---------------+---------+-----------+----------+-------------------+     Summary: RIGHT: - Findings consistent with acute deep vein thrombosis involving the right popliteal vein, and right gastrocnemius veins.  LEFT: - There is no evidence of deep vein thrombosis in the lower extremity. However, portions of this examination were limited- see technologist comments above.  *See table(s) above for measurements and observations. Electronically signed by Heath Lark on 03/19/2020 at 1:54:56 PM.    Final       Scheduled Meds: . albuterol  2  puff Inhalation Q6H  . apixaban  5 mg Oral BID  . vitamin C  500 mg Oral Daily  . aspirin EC  81 mg Oral Q0600  . carvedilol  12.5 mg Oral BID WC  . Chlorhexidine Gluconate Cloth  6 each Topical Daily  . docusate sodium  100 mg Oral Daily  . furosemide  40 mg Oral Once  . influenza vac split quadrivalent PF  0.5 mL Intramuscular Tomorrow-1000  . insulin aspart  0-5 Units Subcutaneous QHS  . insulin aspart  0-9 Units Subcutaneous TID WC  . [START ON 03/21/2020] methylPREDNISolone (SOLU-MEDROL) injection  40 mg Intravenous Daily  . pantoprazole  40 mg Oral Daily  . simvastatin  40 mg Oral q1800  . zinc sulfate  220 mg Oral Daily   Continuous Infusions: . magnesium sulfate bolus IVPB    . remdesivir 100 mg in NS 100 mL 100 mg (03/20/20 0845)     LOS: 3 days   Susa Raring, MD 03/20/2020, 9:47 AM

## 2020-03-20 NOTE — Progress Notes (Addendum)
   ASSESSMENT & PLAN:  Patricia Atkinson is a 59 y.o. female s/p RLE iliofemoral, femoralpopliteal, and tibial thrombectomy. COVID19 infection. AF RVR.   PRN pain control PT / OT / OOB during daylight hours / Ambulate as able Diet as tolerated Continue therapeutic anticoagulation for AF / DVT / acute limb ischemia with Eliquis. Appreciate IM / Cards assistance with AF and COVID.  Disposition will be difficult given her difficult to access home (16 steps) and COVID infection. She may be ready for DC tomorrow if PT feels it safe.   SUBJECTIVE:  Foot pain resolved. Normal motor / sensory function in right foot.  OBJECTIVE:  BP 125/70 (BP Location: Right Arm)   Pulse 68   Temp 97.9 F (36.6 C) (Oral)   Resp 15   Ht 5\' 6"  (1.676 m)   Wt 93.1 kg   SpO2 95%   BMI 33.13 kg/m   Intake/Output Summary (Last 24 hours) at 03/20/2020 0901 Last data filed at 03/19/2020 1600 Gross per 24 hour  Intake 296.71 ml  Output --  Net 296.71 ml    Constitutional: well appearing. no acute distress. Cardiac: irregular. Vascular: Biphasic DS in peroneal and AT. Groin and medial incisions healing well. Groin dressing remains in place. Pulmonary: unlabored on [place]. Abdominal: soft  CBC Latest Ref Rng & Units 03/20/2020 03/19/2020 03/18/2020  WBC 4.0 - 10.5 K/uL 9.9 10.1 7.4  Hemoglobin 12.0 - 15.0 g/dL 03/20/2020) 10.8(L) 9.7(L)  Hematocrit 36 - 46 % 29.8(L) 34.1(L) 30.8(L)  Platelets 150 - 400 K/uL 310 324 253     CMP Latest Ref Rng & Units 03/20/2020 03/19/2020 03/18/2020  Glucose 70 - 99 mg/dL 03/20/2020) 562(Z) 308(M)  BUN 8 - 23 mg/dL 578(I) 69(G) 29(B)  Creatinine 0.44 - 1.00 mg/dL 28(U 1.32) 4.40(N)  Sodium 135 - 145 mmol/L 139 138 138  Potassium 3.5 - 5.1 mmol/L 4.2 4.5 4.1  Chloride 98 - 111 mmol/L 109 106 106  CO2 22 - 32 mmol/L 21(L) 17(L) 20(L)  Calcium 8.9 - 10.3 mg/dL 7.7(L) 7.4(L) 7.5(L)  Total Protein 6.5 - 8.1 g/dL 0.27(O) 6.8 6.3(L)  Total Bilirubin 0.3 - 1.2 mg/dL 1.0 5.3(G) 1.2   Alkaline Phos 38 - 126 U/L 163(H) 180(H) 183(H)  AST 15 - 41 U/L 47(H) 52(H) 32  ALT 0 - 44 U/L 24 21 19     Estimated Creatinine Clearance: 71.5 mL/min (by C-G formula based on SCr of 0.95 mg/dL).  6.4(Q. , MD [facility] Phone Number: [phone] 03/20/2020 9:01 AM

## 2020-03-20 NOTE — Progress Notes (Signed)
SATURATION QUALIFICATIONS: (This note is used to comply with regulatory documentation for home oxygen)  Patient Saturations on Room Air at Rest = 90%  Patient Saturations on Room Air while Ambulating = 85%  Patient Saturations on 3 Liters of oxygen while Ambulating = 92%  Please briefly explain why patient needs home oxygen: Needs oxygen therapy to maintain oxygen saturations while ambulating.

## 2020-03-21 LAB — GLUCOSE, CAPILLARY
Glucose-Capillary: 90 mg/dL (ref 70–99)
Glucose-Capillary: 181 mg/dL — ABNORMAL HIGH (ref 70–99)
Glucose-Capillary: 167 mg/dL — ABNORMAL HIGH (ref 70–99)
Glucose-Capillary: 139 mg/dL — ABNORMAL HIGH (ref 70–99)

## 2020-03-21 LAB — COMPREHENSIVE METABOLIC PANEL
ALT: 36 U/L (ref 0–44)
AST: 59 U/L — ABNORMAL HIGH (ref 15–41)
Albumin: 1.7 g/dL — ABNORMAL LOW (ref 3.5–5.0)
Alkaline Phosphatase: 147 U/L — ABNORMAL HIGH (ref 38–126)
Anion gap: 8 (ref 5–15)
BUN: 34 mg/dL — ABNORMAL HIGH (ref 8–23)
CO2: 23 mmol/L (ref 22–32)
Calcium: 8.2 mg/dL — ABNORMAL LOW (ref 8.9–10.3)
Chloride: 108 mmol/L (ref 98–111)
Creatinine, Ser: 1.14 mg/dL — ABNORMAL HIGH (ref 0.44–1.00)
GFR, Estimated: 55 mL/min — ABNORMAL LOW (ref >60)
Glucose, Bld: 109 mg/dL — ABNORMAL HIGH (ref 70–99)
Potassium: 4 mmol/L (ref 3.5–5.1)
Sodium: 139 mmol/L (ref 135–145)
Total Bilirubin: 0.9 mg/dL (ref 0.3–1.2)
Total Protein: 5.5 g/dL — ABNORMAL LOW (ref 6.5–8.1)

## 2020-03-21 LAB — PHOSPHORUS: Phosphorus: 2.4 mg/dL — ABNORMAL LOW (ref 2.5–4.6)

## 2020-03-21 LAB — MAGNESIUM: Magnesium: 2.1 mg/dL (ref 1.7–2.4)

## 2020-03-21 LAB — C-REACTIVE PROTEIN: CRP: 1.2 mg/dL — ABNORMAL HIGH (ref <1.0)

## 2020-03-21 LAB — BRAIN NATRIURETIC PEPTIDE: B Natriuretic Peptide: 646.8 pg/mL — ABNORMAL HIGH (ref 0.0–100.0)

## 2020-03-21 NOTE — Progress Notes (Signed)
SATURATION QUALIFICATIONS: (This note is used to comply with regulatory documentation for home oxygen)  Patient Saturations on Room Air at Rest = 86%  Patient Saturations on Room Air while Ambulating = NA  Patient Saturations on 2 Liters of oxygen while Ambulating = 88%  Please briefly explain why patient needs home oxygen:  To maintain sats >87% during functional activities   Jerolyn Center, PT Pager [phone]

## 2020-03-21 NOTE — Progress Notes (Signed)
Physical Therapy Treatment Patient Details Name: Patricia Atkinson MRN: [id number] DOB: 15-Oct-1960 Today's Date: 03/21/2020    History of Present Illness 59 y.o. female present to ED 03/17/20 for evaluation of sudden onset right leg pain.  CT aortobifem done which showed an occlusive filling defect right common iliac extending into the external and internal iliac arteries. New afib; +COVID (unvaccinated); 11/25 multiple thrombectomies RLE plus 4 compartment fasciotomies    PT Comments    Patient continues with slow processing and requiring max cues for pursed lip breathing when desaturates. Moderate cues for safe/correct use of RW during transfers and gait. Initiated stair training with pt up/down 3 steps (single step x 3 reps) with cues for sequencing and minguard assist (with rail). Sats 88% on 4L.     Follow Up Recommendations  Home health PT;Supervision - Intermittent     Equipment Recommendations  Rolling walker with 5" wheels    Recommendations for Other Services       Precautions / Restrictions Precautions Precautions: Fall    Mobility  Bed Mobility                  Transfers Overall transfer level: Needs assistance Equipment used: Rolling walker (2 wheeled) Transfers: Sit to/from Stand Sit to Stand: Min guard         General transfer comment: no physical assist; multiple verbal cues for safest hand placement with use of RW x 2 reps  Ambulation/Gait Ambulation/Gait assistance: Min assist Gait Distance (Feet): 20 Feet (seated rest; 20) Assistive device: Rolling walker (2 wheeled) Gait Pattern/deviations: Step-to pattern;Decreased stride length Gait velocity: very slow   General Gait Details: vc for sequencing with pt tending to take an extra step with RLE each time; slow, steady; vc for sequencing   Stairs Stairs: Yes Stairs assistance: Min assist Stair Management: One rail Right;Step to pattern;Forwards Number of Stairs: 3 (1 x 3 using elevated  bed rail as railing) General stair comments: vc for sequencing first 2 steps; 3rd step she corrected herself   Wheelchair Mobility    Modified Rankin (Stroke Patients Only)       Balance Overall balance assessment: Needs assistance Sitting-balance support: Feet supported;Bilateral upper extremity supported Sitting balance-Leahy Scale: Good     Standing balance support: During functional activity;No upper extremity supported Standing balance-Leahy Scale: Fair                              Cognition Arousal/Alertness: Awake/alert Behavior During Therapy: WFL for tasks assessed/performed Overall Cognitive Status: Impaired/Different from baseline Area of Impairment: Problem solving                             Problem Solving: Slow processing;Decreased initiation;Difficulty sequencing;Requires verbal cues General Comments: Slightly less delayed with following commands compared to 11/26      Exercises Other Exercises Other Exercises: ankle pumps x 20 prior to walk Other Exercises: increased time educating on pursed lip breathing during seated rest to recover sats from 85% to 90%. Pt with slow processing and great difficulty with max cues and demonstration    General Comments General comments (skin integrity, edema, etc.): Noted incr edema RLE compared to 11/27. Pt reports she sat with her feet hanging down yesterday      Pertinent Vitals/Pain Pain Assessment: 0-10 Pain Score: 5  Pain Location: RLE Pain Descriptors / Indicators: Tender;Aching Pain Intervention(s): Limited activity within patient's tolerance;Monitored  during session;Premedicated before session;Repositioned;Other (comment) (educated in ankle pumps and elevaton for decr edema)    Home Living                      Prior Function            PT Goals (current goals can now be found in the care plan section) Acute Rehab PT Goals Patient Stated Goal: return to independence Time  For Goal Achievement: 04/01/20 Potential to Achieve Goals: Good Progress towards PT goals: Progressing toward goals    Frequency    Min 3X/week      PT Plan Current plan remains appropriate    Co-evaluation              AM-PAC PT "6 Clicks" Mobility   Outcome Measure  Help needed turning from your back to your side while in a flat bed without using bedrails?: None Help needed moving from lying on your back to sitting on the side of a flat bed without using bedrails?: None Help needed moving to and from a bed to a chair (including a wheelchair)?: A Little Help needed standing up from a chair using your arms (e.g., wheelchair or bedside chair)?: A Little Help needed to walk in hospital room?: A Little Help needed climbing 3-5 steps with a railing? : A Little 6 Click Score: 20    End of Session Equipment Utilized During Treatment: Oxygen Activity Tolerance: Patient tolerated treatment well Patient left: in chair;with call bell/phone within reach Nurse Communication: Mobility status;Other (comment) (desaturates on room air) PT Visit Diagnosis: Pain;Difficulty in walking, not elsewhere classified (R26.2) Pain - Right/Left: Right Pain - part of body: Leg     Time: 4665-9935 PT Time Calculation (min) (ACUTE ONLY): 49 min  Charges:  $Gait Training: 23-37 mins $Therapeutic Exercise: 8-22 mins                      Patricia Atkinson, PT Pager [phone]    Patricia Atkinson 03/21/2020, 10:58 AM

## 2020-03-21 NOTE — Progress Notes (Signed)
PROGRESS NOTE    Patricia SchoolShelita Atkinson  ZOX:[id number] DOB: [birth year] DOA: 03/17/2020 PCP: Patient, No Pcp Per   Brief Narrative:  59 y.o. female with medical history significant of hypertension hyperlipidemia who presents with complaints of right leg pain which started yesterday afternoon.  Pain was described as sharp and severe.  Denied having any injury to onset of pain.  Associated symptoms include mild cough, mild short of breath, and diarrhea which started attending a family member's funeral 1 week ago.  Patient notes that she was not vaccinated against COVID-19.  Denies having any significant vomiting or change in smell/taste.  She had gone to the emergency department and was evaluated and found to be in atrial fibrillation.  She was found to have no dopplerable pulses of the right leg and CT revealed an occlusive filling defect of the right common iliac extending into the external and internal iliac arteries and occlusion of the distal popliteal artery.  She was given heparin bolus and started on heparin drip.  Vascular surgery admitted the patient for open thrombectomy. Chest x-ray was concerning for mild pulmonary edema and cardiomegaly. COVID-19 screening was positive.  Labs revealed WBC 8.3, hemoglobin 15, platelets 409, BUN 51, creatinine 1.4, lactic acid 2.7, and D-dimer 18.06.  Assessment & Plan:    #1 Acute hypoxic respiratory failure likely multifactorial from Covid pneumonia versus acute CHF versus could she have a PE.  Patient admitted with shortness of breath and cough.  She was found to be COVID-19 positive, underlying CHF - she was treated with and has finished her course of COVID-19 treatment with IV steroids and Remdesivir for COVID-19 pneumonia along with as needed Lasix.  Hypoxia is improved.  Advance activity, encouraged to sit up in chair in daytime, use I-S and flutter valve in daytime for pulmonary toiletry.  Now on room air at rest.     #2 acute combined CHF exacerbation with  EF 45%.  Cardiology on board.  Echo with ejection fraction 45 to 50%.  Left ventricle demonstrates global hypokinesis.  Grade 3 diastolic dysfunction.  Now as needed Lasix.  Monitor  #3 New onset atrial fibrillation CHA2DS2-VASc score is 5.  Patient has converted to normal sinus rhythm amiodarone stopped by cardiology.  Continue Eliquis, dose adjusted by vascular surgery, discussed with pharmacist by me personally on 03/20/2020.    #4 Covid positive with chest x-ray infiltrates versus fluid overload with elevated D-dimer.  Continue remdesivir steroids and supportive treatment.  Encourage incentive spirometry flutter valve out of bed as much as tolerated. D-dimer 3.25 from 18.06. CRP 8.6 from 18.7.  #5 Possible AKI versus CKD unknown baseline, - much improved.  #6 History of essential hypertension - blood pressure soft on Coreg.    #5 Right common iliac artery acute thrombus long with right lower extremity DVT.- started Eliquis by vascular 03/19/2020.  Right lower extremity ischemia.  She is status post iliofemoral thrombectomy femoropopliteal thrombectomy and anterior tibial and peroneal thrombectomy and 4 compartment calf fasciotomies of the right lower extremity.  Eliquis dose being monitored by vascular surgery per pharmacist.  Discussed with pharmacist on 03/20/2020.  #6 Hypercoagulable state likely induced by Covid with arterial and venous thrombosis on Eliquis.  Outpatient hematology follow-up.  #7 Right lower extremity acute DVT- on Eliquis per vascular surgery    Estimated body mass index is 33.13 kg/m as calculated from the following:   Height as of this encounter: 5\' 6"  (1.676 m).   Weight as of this encounter: 93.1 kg.  DVT prophylaxis: Eliquis  Code Status: Full code Family Communication: None at bedside  Subjective:  Patient in bed, appears comfortable, denies any headache, no fever, no chest pain or pressure, no shortness of breath , no abdominal pain. No focal  weakness.    Objective: Vitals:   03/21/20 0006 03/21/20 0440 03/21/20 0728 03/21/20 0807  BP: (!) 104/49 114/61 120/65   Pulse: 61 (!) 53    Resp: (!) 21 16 16    Temp: 98.7 F (37.1 C) 98.6 F (37 C) 97.6 F (36.4 C)   TempSrc: Oral Oral Oral   SpO2: 97% 95% 96% 92%  Weight:  93.1 kg    Height:       No intake or output data in the 24 hours ending 03/21/20 0940 Filed Weights   03/19/20 0500 03/20/20 0425 03/21/20 0440  Weight: 93.8 kg 93.1 kg 93.1 kg    Examination:  Awake Alert, No new F.N deficits, Normal affect Elaine.AT,PERRAL Supple Neck,No JVD, No cervical lymphadenopathy appriciated.  Symmetrical Chest wall movement, Good air movement bilaterally, CTAB RRR,No Gallops, Rubs or new Murmurs, No Parasternal Heave +ve B.Sounds, Abd Soft, No tenderness, No organomegaly appriciated, No rebound - guarding or rigidity. No Cyanosis, right lower extremity, mid leg medial side incision site clean with staples on it,     Data Reviewed: I have personally reviewed following labs and imaging studies  CBC: Recent Labs  Lab 03/17/20 0311 03/18/20 0122 03/18/20 0954 03/19/20 0323 03/20/20 0138  WBC 8.3 8.1 7.4 10.1 9.9  NEUTROABS 6.5  --   --   --   --   HGB 15.0 10.6* 9.7* 10.8* 9.6*  HCT 45.6 32.6* 30.8* 34.1* 29.8*  MCV 102.9* 105.2* 106.9* 106.2* 105.3*  PLT 409* 297 253 324 310   Basic Metabolic Panel: Recent Labs  Lab 03/17/20 0311 03/18/20 0122 03/19/20 0323 03/20/20 0138 03/21/20 0335  NA 135 138 138 139 139  K 3.7 4.1 4.5 4.2 4.0  CL 101 106 106 109 108  CO2 21* 20* 17* 21* 23  GLUCOSE 157* 197* 173* 163* 109*  BUN 51* 31* 32* 29* 34*  CREATININE 1.40* 1.03* 1.07* 0.95 1.14*  CALCIUM 8.4* 7.5* 7.4* 7.7* 8.2*  MG  --  2.2 2.1 2.2 2.1  PHOS  --  3.6 2.7 1.8* 2.4*   GFR: Estimated Creatinine Clearance: 59.6 mL/min (A) (by C-G formula based on SCr of 1.14 mg/dL (H)). Liver Function Tests: Recent Labs  Lab 03/18/20 0122 03/19/20 0323 03/20/20 0138  03/21/20 0335  AST 32 52* 47* 59*  ALT 19 21 24  36  ALKPHOS 183* 180* 163* 147*  BILITOT 1.2 1.3* 1.0 0.9  PROT 6.3* 6.8 5.8* 5.5*  ALBUMIN 2.0* 2.1* 1.7* 1.7*   No results for input(s): LIPASE, AMYLASE in the last 168 hours. No results for input(s): AMMONIA in the last 168 hours. Coagulation Profile: Recent Labs  Lab 03/17/20 0311  INR 1.3*   Cardiac Enzymes: Recent Labs  Lab 03/17/20 0311  CKTOTAL 41   BNP (last 3 results) No results for input(s): PROBNP in the last 8760 hours. HbA1C: Recent Labs    03/18/20 0954  HGBA1C 6.0*   CBG: Recent Labs  Lab 03/20/20 0743 03/20/20 1209 03/20/20 1644 03/20/20 2107 03/21/20 0727  GLUCAP 119* 141* 177* 148* 90   Lipid Profile: No results for input(s): CHOL, HDL, LDLCALC, TRIG, CHOLHDL, LDLDIRECT in the last 72 hours. Thyroid Function Tests: Recent Labs    03/19/20 0323  TSH 1.942   Anemia  Panel: Recent Labs    03/19/20 0323 03/20/20 0138  FERRITIN 2,218* 1,631*   Sepsis Labs: Recent Labs  Lab 03/17/20 0311 03/17/20 1856  PROCALCITON  --  0.54  LATICACIDVEN 2.7*  --     Recent Results (from the past 240 hour(s))  Resp Panel by RT-PCR (Flu A&B, Covid) Nasopharyngeal Swab     Status: Abnormal   Collection Time: 03/17/20  7:12 AM   Specimen: Nasopharyngeal Swab; Nasopharyngeal(NP) swabs in vial transport medium  Result Value Ref Range Status   SARS Coronavirus 2 by RT PCR POSITIVE (A) NEGATIVE Final    Comment: RESULT CALLED TO, READ BACK BY AND VERIFIED WITH: HAVILAND,J DR @0857  ON 03/17/20 JACKSON,K (NOTE) SARS-CoV-2 target nucleic acids are DETECTED.  The SARS-CoV-2 RNA is generally detectable in upper respiratory specimens during the acute phase of infection. Positive results are indicative of the presence of the identified virus, but do not rule out bacterial infection or co-infection with other pathogens not detected by the test. Clinical correlation with patient history and other diagnostic  information is necessary to determine patient infection status. The expected result is Negative.  Fact Sheet for Patients: 03/19/20  Fact Sheet for Healthcare Providers: BloggerCourse.com  This test is not yet approved or cleared by the [employer] FDA and  has been authorized for detection and/or diagnosis of SARS-CoV-2 by FDA under an Emergency Use Authorization (EUA).  This EUA will remain in effect (meaning this tes t can be used) for the duration of  the COVID-19 declaration under Section 564(b)(1) of the Act, 21 U.S.C. section 360bbb-3(b)(1), unless the authorization is terminated or revoked sooner.     Influenza A by PCR NEGATIVE NEGATIVE Final   Influenza B by PCR NEGATIVE NEGATIVE Final    Comment: (NOTE) The Xpert Xpress SARS-CoV-2/FLU/RSV plus assay is intended as an aid in the diagnosis of influenza from Nasopharyngeal swab specimens and should not be used as a sole basis for treatment. Nasal washings and aspirates are unacceptable for Xpert Xpress SARS-CoV-2/FLU/RSV testing.  Fact Sheet for Patients: [place]  Fact Sheet for Healthcare Providers: BloggerCourse.com  This test is not yet approved or cleared by the [employer] FDA and has been authorized for detection and/or diagnosis of SARS-CoV-2 by FDA under an Emergency Use Authorization (EUA). This EUA will remain in effect (meaning this test can be used) for the duration of the COVID-19 declaration under Section 564(b)(1) of the Act, 21 U.S.C. section 360bbb-3(b)(1), unless the authorization is terminated or revoked.  Performed at [facility], [address] [place], [place]   Culture, Urine     Status: None   Collection Time: 03/18/20  6:15 PM   Specimen: Urine, Clean Catch  Result Value Ref Range Status   Specimen Description URINE, CLEAN CATCH  Final    Special Requests NONE  Final   Culture   Final    NO GROWTH Performed at [facility], [address], [place], [place]    Report Status 03/19/2020 FINAL  Final         Radiology Studies: DG Chest 1 View  Result Date: 03/19/2020 CLINICAL DATA:  Shortness of breath. EXAM: CHEST  1 VIEW COMPARISON:  None. FINDINGS: Persistent mild to moderate severity increased interstitial prominence is noted. Mild areas of atelectasis and/or infiltrate are also seen within the bilateral lung bases left greater than right. This is increased in severity within the left lung base when compared to the prior study. There  is no evidence of a pleural effusion or pneumothorax. The cardiac silhouette is moderately enlarged. The visualized skeletal structures are unremarkable. IMPRESSION: Persistent pulmonary edema with mild bibasilar atelectasis and/or infiltrate, left greater than right. Electronically Signed   By: Aram Candela M.D.   On: 03/19/2020 15:11   DG Chest Port 1 View  Result Date: 03/20/2020 CLINICAL DATA:  COVID-19 positive with previously documented pneumonia EXAM: PORTABLE CHEST 1 VIEW COMPARISON:  March 19, 2020. FINDINGS: Patchy airspace opacity is seen throughout the lungs bilaterally, most notably in the bases, essentially stable. No appreciable consolidation. Heart is enlarged, stable, with pulmonary vascularity normal. No adenopathy. No bone lesions. IMPRESSION: Multifocal airspace opacity consistent with multifocal atypical organism pneumonia, similar to 1 day prior. Stable cardiac prominence. No adenopathy evident. Electronically Signed   By: Bretta Bang III M.D.   On: 03/20/2020 08:46   VAS [place] LOWER EXTREMITY VENOUS (DVT)  Result Date: 03/19/2020  Lower Venous DVT Study Indications: Covid-19, elevated D-Dimer, Atrial fibrillation  Risk Factors: S/p RLE iliofemoral, femoral, popliteal, and tibial thrombectomy, 03/17/20 Anticoagulation: Eliquis. Limitations: Body  habitus and bandages. Comparison Study: No prior study Performing Technologist: Sherren Kerns RVS  Examination Guidelines: A complete evaluation includes B-mode imaging, spectral Doppler, color Doppler, and power Doppler as needed of all accessible portions of each vessel. Bilateral testing is considered an integral part of a complete examination. Limited examinations for reoccurring indications may be performed as noted. The reflux portion of the exam is performed with the patient in reverse Trendelenburg.  +---------+---------------+---------+-----------+----------+------------------+ RIGHT    CompressibilityPhasicitySpontaneityPropertiesThrombus Aging     +---------+---------------+---------+-----------+----------+------------------+ CFV      Full                                                            +---------+---------------+---------+-----------+----------+------------------+ SFJ      Full                                                            +---------+---------------+---------+-----------+----------+------------------+ FV Prox  Full                                                            +---------+---------------+---------+-----------+----------+------------------+ FV Mid   Full                                                            +---------+---------------+---------+-----------+----------+------------------+ FV DistalFull                                                            +---------+---------------+---------+-----------+----------+------------------+ PFV  Full                                                            +---------+---------------+---------+-----------+----------+------------------+ POP      Partial        No       No                   Acute, non                                                               occlusive           +---------+---------------+---------+-----------+----------+------------------+ PTV      Full                                                            +---------+---------------+---------+-----------+----------+------------------+ PERO                                                  Not well                                                                 visualized         +---------+---------------+---------+-----------+----------+------------------+ Gastroc  Partial        No       No                   Acute, non                                                               occlusive          +---------+---------------+---------+-----------+----------+------------------+   +---------+---------------+---------+-----------+----------+-------------------+ LEFT     CompressibilityPhasicitySpontaneityPropertiesThrombus Aging      +---------+---------------+---------+-----------+----------+-------------------+ CFV      Full           Yes      Yes                  sluggish flow noted +---------+---------------+---------+-----------+----------+-------------------+ SFJ      Full                                                             +---------+---------------+---------+-----------+----------+-------------------+  FV Prox  Full                                                             +---------+---------------+---------+-----------+----------+-------------------+ FV Mid   Full                                                             +---------+---------------+---------+-----------+----------+-------------------+ FV DistalFull                                                             +---------+---------------+---------+-----------+----------+-------------------+ PFV      Full                                                             +---------+---------------+---------+-----------+----------+-------------------+ POP       Full                                                             +---------+---------------+---------+-----------+----------+-------------------+ PTV      Full                                                             +---------+---------------+---------+-----------+----------+-------------------+ PERO                                                  Not well visualized +---------+---------------+---------+-----------+----------+-------------------+ Gastroc  Full                                                             +---------+---------------+---------+-----------+----------+-------------------+     Summary: RIGHT: - Findings consistent with acute deep vein thrombosis involving the right popliteal vein, and right gastrocnemius veins.  LEFT: - There is no evidence of deep vein thrombosis in the lower extremity. However, portions of this examination were limited- see technologist comments above.  *See table(s) above for measurements and observations. Electronically signed by Heath Lark on 03/19/2020 at 1:54:56 PM.    Final       Scheduled Meds: . albuterol  2  puff Inhalation Q6H  . apixaban  5 mg Oral BID  . vitamin C  500 mg Oral Daily  . aspirin EC  81 mg Oral Q0600  . carvedilol  12.5 mg Oral BID WC  . Chlorhexidine Gluconate Cloth  6 each Topical Daily  . docusate sodium  100 mg Oral Daily  . insulin aspart  0-5 Units Subcutaneous QHS  . insulin aspart  0-9 Units Subcutaneous TID WC  . losartan  25 mg Oral Daily  . methylPREDNISolone (SOLU-MEDROL) injection  40 mg Intravenous Daily  . pantoprazole  40 mg Oral Daily  . simvastatin  40 mg Oral q1800  . zinc sulfate  220 mg Oral Daily   Continuous Infusions: . magnesium sulfate bolus IVPB       LOS: 4 days   Susa Raring, MD 03/21/2020, 9:40 AM

## 2020-03-21 NOTE — Progress Notes (Addendum)
Maintaining sinus rhythm at 50s. Reviewed prior notes.   [facility] will sign off.   Medication Recommendations: Continue Coreg 12.5mg  BID, Losartan 25mg  qd, Zocor 40mg  qd, ASA 81mg  qd and Eliquis 5mg  qd Other recommendations (labs, testing, etc):  Ischemic evaluation as outpatient once recover from COVID  Follow up as an outpatient:  Dr. 04/29/20 @ 9am   Agree with recommendations as noted above. 03/21/20

## 2020-03-21 NOTE — Progress Notes (Addendum)
   ASSESSMENT & PLAN:  Patricia Atkinson is a 59 y.o. female s/p RLE iliofemoral, femoralpopliteal, and tibial thrombectomy. COVID19 infection. AF RVR.   PRN pain control PT / OT / OOB during daylight hours / Ambulate as able Diet as tolerated Continue therapeutic anticoagulation for AF / DVT / acute limb ischemia with Eliquis. Appreciate IM / Cards assistance with AF and COVID.  Disposition will be difficult given her difficult to access home (16 steps) and COVID infection. She may be ready for DC tomorrow. Will discuss with care coordination.  SUBJECTIVE:  Foot pain resolved. Normal motor / sensory function in right foot.  OBJECTIVE:  BP 113/63 (BP Location: Right Arm)   Pulse 62   Temp (!) 97.5 F (36.4 C) (Axillary)   Resp 19   Ht 5\' 6"  (1.676 m)   Wt 93.1 kg   SpO2 92%   BMI 33.13 kg/m   Intake/Output Summary (Last 24 hours) at 03/21/2020 1302 Last data filed at 03/21/2020 0948 Gross per 24 hour  Intake 240 ml  Output --  Net 240 ml    Constitutional: well appearing. no acute distress. Cardiac: irregular. Vascular: Biphasic DS in peroneal and AT. Groin and medial incisions healing well. Groin dressing remains in place. Pulmonary: unlabored on [place]. Abdominal: soft  CBC Latest Ref Rng & Units 03/20/2020 03/19/2020 03/18/2020  WBC 4.0 - 10.5 K/uL 9.9 10.1 7.4  Hemoglobin 12.0 - 15.0 g/dL 03/20/2020) 10.8(L) 9.7(L)  Hematocrit 36 - 46 % 29.8(L) 34.1(L) 30.8(L)  Platelets 150 - 400 K/uL 310 324 253     CMP Latest Ref Rng & Units 03/21/2020 03/20/2020 03/19/2020  Glucose 70 - 99 mg/dL 03/21/2020) 716(R) 678(L)  BUN 8 - 23 mg/dL 381(O) 17(P) 10(C)  Creatinine 0.44 - 1.00 mg/dL 58(N) 2.77(O 2.42)  Sodium 135 - 145 mmol/L 139 139 138  Potassium 3.5 - 5.1 mmol/L 4.0 4.2 4.5  Chloride 98 - 111 mmol/L 108 109 106  CO2 22 - 32 mmol/L 23 21(L) 17(L)  Calcium 8.9 - 10.3 mg/dL 8.2(L) 7.7(L) 7.4(L)  Total Protein 6.5 - 8.1 g/dL 3.53(I) 1.4(E) 6.8  Total Bilirubin 0.3 - 1.2 mg/dL 0.9  1.0 3.1(V)  Alkaline Phos 38 - 126 U/L 147(H) 163(H) 180(H)  AST 15 - 41 U/L 59(H) 47(H) 52(H)  ALT 0 - 44 U/L 36 24 21    Estimated Creatinine Clearance: 59.6 mL/min (A) (by C-G formula based on SCr of 1.14 mg/dL (H)).  4.0(G. Rande Brunt, MD [facility] Phone Number: [phone] 03/21/2020 1:02 PM

## 2020-03-21 NOTE — Plan of Care (Signed)
Patient is currently resting in bed. C/o pain , given oxy PRN. Leg elevated on pillows. Q4 neuro checks, doppler at bedside. VSS. Remains on 2L [place]. Call bell within reach. Bed alarm on.   Problem: Education: Goal: Knowledge of risk factors and measures for prevention of condition will improve Outcome: Progressing   Problem: Education: Goal: Knowledge of General Education information will improve Description: Including pain rating scale, medication(s)/side effects and non-pharmacologic comfort measures Outcome: Progressing   Problem: Health Behavior/Discharge Planning: Goal: Ability to manage health-related needs will improve Outcome: Progressing

## 2020-03-22 LAB — COMPREHENSIVE METABOLIC PANEL
ALT: 42 U/L (ref 0–44)
AST: 58 U/L — ABNORMAL HIGH (ref 15–41)
Albumin: 1.8 g/dL — ABNORMAL LOW (ref 3.5–5.0)
Alkaline Phosphatase: 195 U/L — ABNORMAL HIGH (ref 38–126)
Anion gap: 12 (ref 5–15)
BUN: 34 mg/dL — ABNORMAL HIGH (ref 8–23)
CO2: 20 mmol/L — ABNORMAL LOW (ref 22–32)
Calcium: 8.4 mg/dL — ABNORMAL LOW (ref 8.9–10.3)
Chloride: 106 mmol/L (ref 98–111)
Creatinine, Ser: 1.07 mg/dL — ABNORMAL HIGH (ref 0.44–1.00)
GFR, Estimated: 59 mL/min — ABNORMAL LOW (ref >60)
Glucose, Bld: 117 mg/dL — ABNORMAL HIGH (ref 70–99)
Potassium: 4.3 mmol/L (ref 3.5–5.1)
Sodium: 138 mmol/L (ref 135–145)
Total Bilirubin: 1 mg/dL (ref 0.3–1.2)
Total Protein: 5.8 g/dL — ABNORMAL LOW (ref 6.5–8.1)

## 2020-03-22 LAB — MAGNESIUM: Magnesium: 1.7 mg/dL (ref 1.7–2.4)

## 2020-03-22 LAB — GLUCOSE, CAPILLARY
Glucose-Capillary: 91 mg/dL (ref 70–99)
Glucose-Capillary: 99 mg/dL (ref 70–99)

## 2020-03-22 LAB — BRAIN NATRIURETIC PEPTIDE: B Natriuretic Peptide: 720.5 pg/mL — ABNORMAL HIGH (ref 0.0–100.0)

## 2020-03-22 LAB — C-REACTIVE PROTEIN: CRP: 1.1 mg/dL — ABNORMAL HIGH (ref <1.0)

## 2020-03-22 LAB — PHOSPHORUS: Phosphorus: 2.9 mg/dL (ref 2.5–4.6)

## 2020-03-22 MED ORDER — LOSARTAN POTASSIUM 25 MG PO TABS
25.0000 mg | ORAL_TABLET | Freq: Every day | ORAL | 1 refills | Status: DC
Start: 2020-03-23 — End: 2020-05-11

## 2020-03-22 MED ORDER — APIXABAN 5 MG PO TABS
5.0000 mg | ORAL_TABLET | Freq: Two times a day (BID) | ORAL | 2 refills | Status: DC
Start: 2020-03-22 — End: 2020-04-26

## 2020-03-22 MED ORDER — ALBUTEROL SULFATE HFA 108 (90 BASE) MCG/ACT IN AERS: 2.0000 | INHALATION_SPRAY | RESPIRATORY_TRACT | 1 refills | Status: AC | PRN

## 2020-03-22 MED ORDER — OXYCODONE HCL 5 MG PO TABS: 5.0000 mg | ORAL_TABLET | Freq: Four times a day (QID) | ORAL | 0 refills | Status: AC | PRN

## 2020-03-22 MED ORDER — ASPIRIN 81 MG PO TBEC: 81.0000 mg | DELAYED_RELEASE_TABLET | Freq: Every day | ORAL | 11 refills | Status: AC

## 2020-03-22 NOTE — Care Management Important Message (Signed)
Important Message  Patient Details  Name: Patricia Atkinson MRN: [id number] Date of Birth: 1961/02/02   Medicare Important Message Given:  Yes - Important Message mailed due to current National Emergency  Verbal consent obtained due to current National Emergency  Relationship to patient: Self Contact Name: Miller Edgington Call Date: 03/22/20  Time: 1349 Phone: [phone] Outcome: Spoke with contact Important Message mailed to: Patient address on file    Orson Aloe 03/22/2020, 1:50 PM

## 2020-03-22 NOTE — Progress Notes (Signed)
SATURATION QUALIFICATIONS: (This note is used to comply with regulatory documentation for home oxygen)  Patient Saturations on Room Air at Rest = 94%  Patient Saturations on Room Air while Ambulating = 86%  Patient Saturations on 3 Liters of oxygen while Ambulating = 90%  Please briefly explain why patient needs home oxygen:  Sats decrease to 86% (especially with climbing steps and has 16 steps to second floor apartment).    Jerolyn Center, PT Pager [phone]

## 2020-03-22 NOTE — Progress Notes (Signed)
   ASSESSMENT & PLAN:  Patricia Atkinson is a 59 y.o. female s/p RLE iliofemoral, femoralpopliteal, and tibial thrombectomy. COVID19 infection. AF RVR.   PRN pain control PT / OT / OOB during daylight hours / Ambulate as able Diet as tolerated Continue therapeutic anticoagulation for AF / DVT / acute limb ischemia with Eliquis. Appreciate IM / Cards assistance with AF and COVID.  OK for DC today with Eliquis and pain medicine. Follow up with me in 1 month with ABI and Duplex. Instructed in groin care.  SUBJECTIVE:  Foot pain resolved. Normal motor / sensory function in right foot.  OBJECTIVE:  BP (!) 151/58 (BP Location: Right Wrist)   Pulse (!) 56   Temp 97.8 F (36.6 C) (Axillary)   Resp 18   Ht 5\' 6"  (1.676 m)   Wt 90.6 kg   SpO2 92%   BMI 32.24 kg/m   Intake/Output Summary (Last 24 hours) at 03/22/2020 0750 Last data filed at 03/21/2020 1901 Gross per 24 hour  Intake 960 ml  Output -  Net 960 ml    Constitutional: well appearing. no acute distress. Cardiac: irregular. Vascular: Biphasic DS in peroneal and AT. Groin and medial incisions healing well. Groin dressing remains in place. Pulmonary: unlabored on [place]. Abdominal: soft  CBC Latest Ref Rng & Units 03/20/2020 03/19/2020 03/18/2020  WBC 4.0 - 10.5 K/uL 9.9 10.1 7.4  Hemoglobin 12.0 - 15.0 g/dL 03/20/2020) 10.8(L) 9.7(L)  Hematocrit 36 - 46 % 29.8(L) 34.1(L) 30.8(L)  Platelets 150 - 400 K/uL 310 324 253     CMP Latest Ref Rng & Units 03/22/2020 03/21/2020 03/20/2020  Glucose 70 - 99 mg/dL 03/22/2020) 096(E) 454(U)  BUN 8 - 23 mg/dL 981(X) 91(Y) 78(G)  Creatinine 0.44 - 1.00 mg/dL 95(A) 2.13(Y) 8.65(H  Sodium 135 - 145 mmol/L 138 139 139  Potassium 3.5 - 5.1 mmol/L 4.3 4.0 4.2  Chloride 98 - 111 mmol/L 106 108 109  CO2 22 - 32 mmol/L 20(L) 23 21(L)  Calcium 8.9 - 10.3 mg/dL 8.46) 9.6(E) 7.7(L)  Total Protein 6.5 - 8.1 g/dL 9.5(M) 8.4(X) 3.2(G)  Total Bilirubin 0.3 - 1.2 mg/dL 1.0 0.9 1.0  Alkaline Phos 38 - 126 U/L  195(H) 147(H) 163(H)  AST 15 - 41 U/L 58(H) 59(H) 47(H)  ALT 0 - 44 U/L 42 36 24    Estimated Creatinine Clearance: 62.6 mL/min (A) (by C-G formula based on SCr of 1.07 mg/dL (H)).  4.0(N. Rande Brunt, MD [facility] Phone Number: [phone] 03/22/2020 7:50 AM

## 2020-03-22 NOTE — Progress Notes (Signed)
Occupational Therapy Treatment Patient Details Name: Patricia Atkinson MRN: [id number] DOB: 02/08/1961 Today's Date: 03/22/2020    History of present illness 59 y.o. female present to ED 03/17/20 for evaluation of sudden onset right leg pain.  CT aortobifem done which showed an occlusive filling defect right common iliac extending into the external and internal iliac arteries. New afib; +COVID (unvaccinated); 11/25 multiple thrombectomies RLE plus 4 compartment fasciotomies; +extensive DVT RLE   OT comments  Pt progressing towards OT goals today. Still presents with cognitive impairments and difficulty sequencing and with decreased safety awareness managing O2 lines and RW. Pt able to perform toilet transfer today with min A to manage O2 tubing, desaturated to 86 and able to bring back up to 92% with PLB and seated rest break. vc for safety with RW, and decreased access to RLE continues post-op. Pt states that her mother will assist her. Recommendation for HHOT essential to maximize safety and independence post-acute.    Follow Up Recommendations  Home health OT;Supervision - Intermittent    Equipment Recommendations  3 in 1 bedside commode    Recommendations for Other Services      Precautions / Restrictions Precautions Precautions: Fall       Mobility Bed Mobility Overal bed mobility: Modified Independent Bed Mobility: Supine to Sit           General bed mobility comments: OOB in recliner at beginning and end of session  Transfers Overall transfer level: Needs assistance Equipment used: Rolling walker (2 wheeled) Transfers: Sit to/from Stand Sit to Stand: Min guard         General transfer comment: no physical assist; multiple verbal cues for safe hand placement    Balance Overall balance assessment: Needs assistance Sitting-balance support: Feet supported;Bilateral upper extremity supported Sitting balance-Leahy Scale: Good     Standing balance support: During  functional activity;No upper extremity supported Standing balance-Leahy Scale: Fair                             ADL either performed or assessed with clinical judgement   ADL Overall ADL's : Needs assistance/impaired     Grooming: Min guard;Sitting;Wash/dry hands Grooming Details (indicate cue type and reason): standing at sink to wash hands after toilet transfer. Pt requiring sequencing cues to problem solve                 Toilet Transfer: Min guard;Ambulation;RW;Regular Toilet;Grab bars Toilet Transfer Details (indicate cue type and reason): vc for safe hand placement; Pt required assist for management of O2 tubing (practicing for home environment) Toileting- Clothing Manipulation and Hygiene: Min guard;Sit to/from stand Toileting - Clothing Manipulation Details (indicate cue type and reason): good use of grab bar     Functional mobility during ADLs: Min guard;Rolling walker;Cueing for sequencing;Cueing for safety General ADL Comments: Pt required vc for safey and sequencing managing RW and O2 tubing     Vision       Perception     Praxis      Cognition Arousal/Alertness: Awake/alert Behavior During Therapy: WFL for tasks assessed/performed Overall Cognitive Status: Impaired/Different from baseline Area of Impairment: Problem solving;Safety/judgement                         Safety/Judgement: Decreased awareness of safety;Decreased awareness of deficits   Problem Solving: Slow processing;Decreased initiation;Difficulty sequencing;Requires verbal cues General Comments: continues to require directional cues for next step in process  Exercises Exercises: Other exercises Other Exercises Other Exercises: ankle pumps x 20 prior to walk   Shoulder Instructions       General Comments Pt desaturates with activity - down to 86% with activity and required cues for PLB and sittig position    Pertinent Vitals/ Pain       Pain Assessment:  0-10 Pain Score: 5  Pain Location: RLE Pain Descriptors / Indicators: Tender;Aching Pain Intervention(s): RN gave pain meds during session;Monitored during session  Home Living                                          Prior Functioning/Environment              Frequency  Min 3X/week        Progress Toward Goals  OT Goals(current goals can now be found in the care plan section)  Progress towards OT goals: Progressing toward goals  Acute Rehab OT Goals Patient Stated Goal: return to independence OT Goal Formulation: With patient Time For Goal Achievement: 04/01/20 Potential to Achieve Goals: Good  Plan Discharge plan remains appropriate    Co-evaluation                 AM-PAC OT "6 Clicks" Daily Activity     Outcome Measure   Help from another person eating meals?: A Little Help from another person taking care of personal grooming?: A Little Help from another person toileting, which includes using toliet, bedpan, or urinal?: A Little Help from another person bathing (including washing, rinsing, drying)?: A Lot Help from another person to put on and taking off regular upper body clothing?: A Little Help from another person to put on and taking off regular lower body clothing?: A Lot 6 Click Score: 16    End of Session Equipment Utilized During Treatment: Gait belt;Rolling walker;Oxygen (3L)  OT Visit Diagnosis: Unsteadiness on feet (R26.81);Other abnormalities of gait and mobility (R26.89);Muscle weakness (generalized) (M62.81);Pain Pain - Right/Left: Right Pain - part of body: Leg   Activity Tolerance Patient tolerated treatment well   Patient Left in chair;with call bell/phone within reach   Nurse Communication Mobility status        Time: 1201-1225 OT Time Calculation (min): 24 min  Charges: OT General Charges $OT Visit: 1 Visit OT Treatments $Self Care/Home Management : 23-37 mins  Nyoka Cowden OTR/L Acute Rehabilitation  Services Pager: [phone] Office: [phone]  Evern Bio Tobie Hellen 03/22/2020, 3:23 PM

## 2020-03-22 NOTE — Progress Notes (Signed)
Patricia Atkinson to be D/C'd Home per MD order.  Discussed with the patient and all questions fully answered.  VSS, Skin clean, dry and intact without evidence of skin break down, no evidence of skin tears noted. IV catheter discontinued intact. Site without signs and symptoms of complications. Dressing and pressure applied.  An After Visit Summary was printed and given to the patient. Patient received oxygen and walker.   D/c education completed with patient/family including follow up instructions, medication list, d/c activities limitations if indicated, with other d/c instructions as indicated by MD - patient able to verbalize understanding, all questions fully answered.   Patient instructed to return to ED, call 911, or call MD for any changes in condition.   Patient escorted via WC, and D/C home via private auto.  Velva Harman 03/22/2020 4:38 PM

## 2020-03-22 NOTE — TOC Transition Note (Signed)
Transition of Care [facility]) - CM/SW Discharge Note   Patient Details  Name: Patricia Atkinson MRN: [id number] Date of Birth: 09/14/1960  Transition of Care [facility]) CM/SW Contact:  Bess Kinds, RN Phone Number: [phone] 03/22/2020, 3:46 PM   Clinical Narrative:     Spoke with patient on the phone to discuss transition plans for discharge today. Patient lives at home with her mother who does not drive. Discussed need for transportation home. Covid transportation arranged through [facility] for this afternoon after 3pm.   Discussed post covid appointment on 12/8 2pm. Covid transportation arranged through [facility] for this appointment. Patient normally uses her Medicare transportation through [place], however, d/t covid required an alternate transportation.   Discussed getting PCP. Patient stated that she has recently moved to the area. Advised to call member services on her [employer] card to get list of in network providers. Patient verbalized understanding and stated that she did have a number that she can call.   Discussed oxygen requirements and walker. Agreed to [employer] delivering oxygen and walker to the room.   Discussed recommendations for home health. Referral accepted by [facility] for PT with start of care one week out for Tuesday 12/7. Patient aware of accepting agency and agreeable to start of care.   No further TOC needs identified.   Final next level of care: Home w Home Health Services Barriers to Discharge: No Barriers Identified   Patient Goals and CMS Choice Patient states their goals for this hospitalization and ongoing recovery are:: return home CMS Medicare.gov Compare Post Acute Care list provided to:: Patient Choice offered to / list presented to : Patient  Discharge Placement                       Discharge Plan and Services                DME Arranged: Oxygen, Walker DME Agency: [facility] Date DME Agency Contacted: 03/22/20 Time DME Agency  Contacted: 1400 Representative spoke with at DME Agency: Velna Hatchet HH Arranged: PT HH Agency: [facility] Date HH Agency Contacted: 03/22/20 Time HH Agency Contacted: 1541 Representative spoke with at [facility] Agency: breanna  Social Determinants of Health (SDOH) Interventions     Readmission Risk Interventions No flowsheet data found.

## 2020-03-22 NOTE — Progress Notes (Signed)
PROGRESS NOTE    Patricia Atkinson  ZOX:[id number] DOB: 1960-11-13 DOA: 03/17/2020 PCP: Patricia Atkinson   Brief Narrative:  59 y.o. female with medical history significant of hypertension hyperlipidemia who presents with complaints of right leg pain which started yesterday afternoon.  Pain was described as sharp and severe.  Denied having any injury to onset of pain.  Associated symptoms include mild cough, mild short of breath, and diarrhea which started attending a family member's funeral 1 week ago.  Patient notes that she was not vaccinated against COVID-19.  Denies having any significant vomiting or change in smell/taste.  She had gone to the emergency department and was evaluated and found to be in atrial fibrillation.  She was found to have no dopplerable pulses of the right leg and CT revealed an occlusive filling defect of the right common iliac extending into the external and internal iliac arteries and occlusion of the distal popliteal artery.  She was given heparin bolus and started on heparin drip.  Vascular surgery admitted the patient for open thrombectomy. Chest x-ray was concerning for mild pulmonary edema and cardiomegaly. COVID-19 screening was positive.  Labs revealed WBC 8.3, hemoglobin 15, platelets 409, BUN 51, creatinine 1.4, lactic acid 2.7, and D-dimer 18.06.  Assessment & Plan:    #1 Acute hypoxic respiratory failure likely multifactorial from Covid pneumonia versus acute CHF versus could she have a PE.  Patient admitted with shortness of breath and cough.  She was found to be COVID-19 positive, has finished her COVID-19 treatment, rescue inhaler and as needed home oxygen upon discharge along with cardiac medications as below.     #2 Acute combined CHF exacerbation with EF 45%.  Cardiology on board.  Echo with ejection fraction 45 to 50%.  Left ventricle demonstrates global hypokinesis.  Grade 3 diastolic dysfunction.  Could be discharged on present cardiac medications  unchanged.  Outpatient cardiology follow-up within a week of discharge.  #3 New onset atrial fibrillation CHA2DS2-VASc score is 5.  Patient has converted to normal sinus rhythm amiodarone stopped by cardiology.  Continue Eliquis, and be discharged on present hospital medications from our standpoint.    DC Meds Atkinson Cards -   Coreg 12.5mg  BID, Losartan 25mg  qd, Zocor 40mg  qd, ASA 81mg  qd and Eliquis 5mg  qd  +  Albuterol rescue inhaler every 4 as needed shortness of breath & DME Home o2 ( ordered)     #4 Covid positive with chest x-ray infiltrates versus fluid overload with elevated D-dimer.  Able she has finished her COVID-19 treatment completely.  Home oxygen as needed ordered in case it is needed temporarily.  Isolation instructions written.  #5 Possible AKI versus CKD unknown baseline, - much improved.  #6 History of essential hypertension - blood pressure soft on Coreg.    #5 Right common iliac artery acute thrombus long with right lower extremity DVT.- started Eliquis by vascular 03/19/2020.  Right lower extremity ischemia.  She is status post iliofemoral thrombectomy femoropopliteal thrombectomy and anterior tibial and peroneal thrombectomy and 4 compartment calf fasciotomies of the right lower extremity.  Eliquis dose being monitored by vascular surgery Atkinson pharmacist.  Discussed with pharmacist on 03/20/2020.  #6 Hypercoagulable state likely induced by Covid with arterial and venous thrombosis on Eliquis.  Outpatient hematology follow-up.  #7 Right lower extremity acute DVT- on Eliquis Atkinson vascular surgery    Estimated body mass index is 32.24 kg/m as calculated from the following:   Height as of this encounter: 5\' 6"  (1.676  m).   Weight as of this encounter: 90.6 kg.  DVT prophylaxis: Eliquis  Code Status: Full code Family Communication: None at bedside  Subjective:  Patient in bed, appears comfortable, denies any headache, no fever, no chest pain or pressure, no  shortness of breath , no abdominal pain. No focal weakness.    Objective: Vitals:   03/22/20 0031 03/22/20 0325 03/22/20 0500 03/22/20 0700  BP: (!) 147/86 (!) 143/85  (!) 151/58  Pulse: 71 60  (!) 56  Resp: 19 16  18   Temp: 98 F (36.7 C) 98 F (36.7 C)  97.8 F (36.6 C)  TempSrc:  Oral  Axillary  SpO2: 97% 92%  92%  Weight:   90.6 kg   Height:        Intake/Output Summary (Last 24 hours) at 03/22/2020 1100 Last data filed at 03/22/2020 0900 Gross Atkinson 24 hour  Intake 960 ml  Output --  Net 960 ml   Filed Weights   03/20/20 0425 03/21/20 0440 03/22/20 0500  Weight: 93.1 kg 93.1 kg 90.6 kg    Examination:  Awake Alert, No new F.N deficits, Normal affect Patricia Atkinson,PERRAL Supple Neck,No JVD, No cervical lymphadenopathy appriciated.  Symmetrical Chest wall movement, Good air movement bilaterally, CTAB RRR,No Gallops, Rubs or new Murmurs, No Parasternal Heave +ve B.Sounds, Abd Soft, No tenderness, No organomegaly appriciated, No rebound - guarding or rigidity. No Cyanosis, right lower extremity, mid leg medial side incision site clean with staples on it,     Data Reviewed: I have personally reviewed following labs and imaging studies  CBC: Recent Labs  Lab 03/17/20 0311 03/18/20 0122 03/18/20 0954 03/19/20 0323 03/20/20 0138  WBC 8.3 8.1 7.4 10.1 9.9  NEUTROABS 6.5  --   --   --   --   HGB 15.0 10.6* 9.7* 10.8* 9.6*  HCT 45.6 32.6* 30.8* 34.1* 29.8*  MCV 102.9* 105.2* 106.9* 106.2* 105.3*  PLT 409* 297 253 324 310   Basic Metabolic Panel: Recent Labs  Lab 03/18/20 0122 03/19/20 0323 03/20/20 0138 03/21/20 0335 03/22/20 0146  NA 138 138 139 139 138  K 4.1 4.5 4.2 4.0 4.3  CL 106 106 109 108 106  CO2 20* 17* 21* 23 20*  GLUCOSE 197* 173* 163* 109* 117*  BUN 31* 32* 29* 34* 34*  CREATININE 1.03* 1.07* 0.95 1.14* 1.07*  CALCIUM 7.5* 7.4* 7.7* 8.2* 8.4*  MG 2.2 2.1 2.2 2.1 1.7  PHOS 3.6 2.7 1.8* 2.4* 2.9   GFR: Estimated Creatinine Clearance: 62.6  mL/min (A) (by C-G formula based on SCr of 1.07 mg/dL (H)). Liver Function Tests: Recent Labs  Lab 03/18/20 0122 03/19/20 0323 03/20/20 0138 03/21/20 0335 03/22/20 0146  AST 32 52* 47* 59* 58*  ALT 19 21 24  36 42  ALKPHOS 183* 180* 163* 147* 195*  BILITOT 1.2 1.3* 1.0 0.9 1.0  PROT 6.3* 6.8 5.8* 5.5* 5.8*  ALBUMIN 2.0* 2.1* 1.7* 1.7* 1.8*   No results for input(s): LIPASE, AMYLASE in the last 168 hours. No results for input(s): AMMONIA in the last 168 hours. Coagulation Profile: Recent Labs  Lab 03/17/20 0311  INR 1.3*   Cardiac Enzymes: Recent Labs  Lab 03/17/20 0311  CKTOTAL 41   BNP (last 3 results) No results for input(s): PROBNP in the last 8760 hours. HbA1C: No results for input(s): HGBA1C in the last 72 hours. CBG: Recent Labs  Lab 03/21/20 0727 03/21/20 1203 03/21/20 1711 03/21/20 1952 03/22/20 0729  GLUCAP 90 181* 167* 139* 91  Lipid Profile: No results for input(s): CHOL, HDL, LDLCALC, TRIG, CHOLHDL, LDLDIRECT in the last 72 hours. Thyroid Function Tests: No results for input(s): TSH, T4TOTAL, FREET4, T3FREE, THYROIDAB in the last 72 hours. Anemia Panel: Recent Labs    03/20/20 0138  FERRITIN 1,631*   Sepsis Labs: Recent Labs  Lab 03/17/20 0311 03/17/20 1856  PROCALCITON  --  0.54  LATICACIDVEN 2.7*  --     Recent Results (from the past 240 hour(s))  Resp Panel by RT-PCR (Flu A&B, Covid) Nasopharyngeal Swab     Status: Abnormal   Collection Time: 03/17/20  7:12 AM   Specimen: Nasopharyngeal Swab; Nasopharyngeal(NP) swabs in vial transport medium  Result Value Ref Range Status   SARS Coronavirus 2 by RT PCR POSITIVE (A) NEGATIVE Final    Comment: RESULT CALLED TO, READ BACK BY AND VERIFIED WITH: HAVILAND,J DR @0857  ON 03/17/20 JACKSON,K (NOTE) SARS-CoV-2 target nucleic acids are DETECTED.  The SARS-CoV-2 RNA is generally detectable in upper respiratory specimens during the acute phase of infection. Positive results are indicative  of the presence of the identified virus, but do not rule out bacterial infection or co-infection with other pathogens not detected by the test. Clinical correlation with patient history and other diagnostic information is necessary to determine patient infection status. The expected result is Negative.  Fact Sheet for Patients: 03/19/20  Fact Sheet for Healthcare Providers: BloggerCourse.com  This test is not yet approved or cleared by the [employer]ious[employer].[place] FDA and  has been authorized for detection and/or diagnosis of SARS-CoV-2 by FDA under an Emergency Use Authorization (EUA).  This EUA will remain in effect (meaning this tes t can be used) for the duration of  the COVID-19 declaration under Section 564(b)(1) of the Act, 21 U.S.C. section 360bbb-3(b)(1), unless the authorization is terminated or revoked sooner.     Influenza A by PCR NEGATIVE NEGATIVE Final   Influenza B by PCR NEGATIVE NEGATIVE Final    Comment: (NOTE) The Xpert Xpress SARS-CoV-2/FLU/RSV plus assay is intended as an aid in the diagnosis of influenza from Nasopharyngeal swab specimens and should not be used as a sole basis for treatment. Nasal washings and aspirates are unacceptable for Xpert Xpress SARS-CoV-2/FLU/RSV testing.  Fact Sheet for Patients: [place]  Fact Sheet for Healthcare Providers: BloggerCourse.com  This test is not yet approved or cleared by the [employer]iousBroker.it FDA and has been authorized for detection and/or diagnosis of SARS-CoV-2 by FDA under an Emergency Use Authorization (EUA). This EUA will remain in effect (meaning this test can be used) for the duration of the COVID-19 declaration under Section 564(b)(1) of the Act, 21 U.S.C. section 360bbb-3(b)(1), unless the authorization is terminated or revoked.  Performed at [facility], [address], [place], [place]   Culture, Urine     Status: None   Collection Time: 03/18/20  6:15 PM   Specimen: Urine, Clean Catch  Result Value Ref Range Status   Specimen Description URINE, CLEAN CATCH  Final   Special Requests NONE  Final   Culture   Final    NO GROWTH Performed at [facility], [address], [place], [place]    Report Status 03/19/2020 FINAL  Final         Radiology Studies: No results found.    Scheduled Meds: . albuterol  2 puff Inhalation Q6H  . apixaban  5 mg Oral BID  . vitamin C  500 mg Oral Daily  . aspirin EC  81 mg Oral Q0600  . carvedilol  12.5 mg Oral BID WC  . Chlorhexidine Gluconate Cloth  6 each Topical Daily  . docusate sodium  100 mg Oral Daily  . insulin aspart  0-5 Units Subcutaneous QHS  . insulin aspart  0-9 Units Subcutaneous TID WC  . losartan  25 mg Oral Daily  . pantoprazole  40 mg Oral Daily  . simvastatin  40 mg Oral q1800  . zinc sulfate  220 mg Oral Daily   Continuous Infusions: . magnesium sulfate bolus IVPB       LOS: 5 days   Susa Raring, MD 03/22/2020, 11:00 AM

## 2020-03-22 NOTE — Plan of Care (Signed)
Patient is currently resting in bed. C/o pain, given oxy PRN. Leg elevated on pillows. OOB w/ walker to BSC. VSS. Remains on room air. Call bell within reach. Bed alarm on.   Problem: Education: Goal: Knowledge of risk factors and measures for prevention of condition will improve Outcome: Progressing   Problem: Education: Goal: Knowledge of General Education information will improve Description: Including pain rating scale, medication(s)/side effects and non-pharmacologic comfort measures Outcome: Progressing   Problem: Health Behavior/Discharge Planning: Goal: Ability to manage health-related needs will improve Outcome: Progressing

## 2020-03-22 NOTE — Progress Notes (Signed)
Physical Therapy Treatment Patient Details Name: Patricia Atkinson MRN: [id number] DOB: [birth year] Today's Date: 03/22/2020    History of Present Illness 60 y.o. female present to ED 03/17/20 for evaluation of sudden onset right leg pain.  CT aortobifem done which showed an occlusive filling defect right common iliac extending into the external and internal iliac arteries. New afib; +COVID (unvaccinated); 11/25 multiple thrombectomies RLE plus 4 compartment fasciotomies; +extensive DVT RLE    PT Comments    Patient seen for gait training (including stairs) with oxygen saturation monitoring. (See separate note for ambulatory sats--does need home O2). Patient able to ascend/descend 8 steps with rail with very slow pace. Patient feels she is ready to discharge home. OT to see prior to discharge to address mobility with O2 extension tubing management. RN made aware.    Follow Up Recommendations  Home health PT;Supervision - Intermittent     Equipment Recommendations  Rolling walker with 5" wheels    Recommendations for Other Services       Precautions / Restrictions Precautions Precautions: Fall    Mobility  Bed Mobility Overal bed mobility: Modified Independent Bed Mobility: Supine to Sit           General bed mobility comments: HOB 0; no rail  Transfers Overall transfer level: Needs assistance Equipment used: Rolling walker (2 wheeled) Transfers: Sit to/from Stand Sit to Stand: Min guard         General transfer comment: no physical assist; multiple verbal cues for safest hand placement with use of RW x 3 reps  Ambulation/Gait Ambulation/Gait assistance: Min assist Gait Distance (Feet): 20 Feet (seated, 25) Assistive device: Rolling walker (2 wheeled) Gait Pattern/deviations: Step-to pattern;Decreased stride length Gait velocity: very slow   General Gait Details: vc for sequencing with pt tending to take an extra step with RLE each time; slow, steady; vc for  sequencing   Stairs Stairs: Yes Stairs assistance: Min assist Stair Management: One rail Right;Step to pattern;Forwards Number of Stairs: 8 General stair comments: pt on room air with sats 88% with twice dipping to 86% for <5 seconds; moving very slowly and using PLB    Wheelchair Mobility    Modified Rankin (Stroke Patients Only)       Balance Overall balance assessment: Needs assistance Sitting-balance support: Feet supported;Bilateral upper extremity supported Sitting balance-Leahy Scale: Good     Standing balance support: During functional activity;No upper extremity supported Standing balance-Leahy Scale: Fair                              Cognition Arousal/Alertness: Awake/alert Behavior During Therapy: WFL for tasks assessed/performed Overall Cognitive Status: Impaired/Different from baseline Area of Impairment: Problem solving                             Problem Solving: Slow processing;Decreased initiation;Difficulty sequencing;Requires verbal cues General Comments: continues to require directional cues for next step in process      Exercises Other Exercises Other Exercises: ankle pumps x 20 prior to walk    General Comments General comments (skin integrity, edema, etc.): Incr difficulty with sat monitoring device; RN in to assist and first brought in new cable (did not work) and then brought in bedside monitor      Pertinent Vitals/Pain Pain Assessment: 0-10 Pain Score: 5  Pain Location: RLE Pain Descriptors / Indicators: Tender;Aching Pain Intervention(s): Limited activity within patient's tolerance;Monitored during session;Repositioned  Home Living                      Prior Function            PT Goals (current goals can now be found in the care plan section) Acute Rehab PT Goals Patient Stated Goal: return to independence Time For Goal Achievement: 04/01/20 Potential to Achieve Goals: Good Progress  towards PT goals: Progressing toward goals    Frequency    Min 3X/week      PT Plan Current plan remains appropriate    Co-evaluation              AM-PAC PT "6 Clicks" Mobility   Outcome Measure  Help needed turning from your back to your side while in a flat bed without using bedrails?: None Help needed moving from lying on your back to sitting on the side of a flat bed without using bedrails?: None Help needed moving to and from a bed to a chair (including a wheelchair)?: A Little Help needed standing up from a chair using your arms (e.g., wheelchair or bedside chair)?: A Little Help needed to walk in hospital room?: A Little Help needed climbing 3-5 steps with a railing? : A Little 6 Click Score: 20    End of Session Equipment Utilized During Treatment: Oxygen;Gait belt Activity Tolerance: Patient tolerated treatment well Patient left: in chair;with call bell/phone within reach Nurse Communication: Mobility status;Other (comment) (desaturates on room air) PT Visit Diagnosis: Pain;Difficulty in walking, not elsewhere classified (R26.2) Pain - Right/Left: Right Pain - part of body: Leg     Time: 3557-3220 PT Time Calculation (min) (ACUTE ONLY): 67 min  Charges:  $Gait Training: 38-52 mins                      Patricia Atkinson, PT Pager [phone]    Patricia Atkinson 03/22/2020, 12:55 PM

## 2020-03-22 NOTE — Discharge Instructions (Signed)
Heart healthy diet with 1.5 L fluid restriction per day.  Kindly follow with PCP and primary cardiologist within a week.  Get CBC, CMP and a two-view chest x-ray checked by PCP within a week.       Person Under Monitoring Name: Patricia Atkinson  Location: [address] [place] [address]   Infection Prevention Recommendations for Individuals Confirmed to have, or Being Evaluated for, 2019 Novel Coronavirus (COVID-19) Infection Who Receive Care at Home  Individuals who are confirmed to have, or are being evaluated for, COVID-19 should follow the prevention steps below until a healthcare provider or local or state health department says they can return to normal activities.  Stay home except to get medical care You should restrict activities outside your home, except for getting medical care. Do not go to work, school, or public areas, and do not use public transportation or taxis.  Call ahead before visiting your doctor Before your medical appointment, call the healthcare provider and tell them that you have, or are being evaluated for, COVID-19 infection. This will help the healthcare provider's office take steps to keep other people from getting infected. Ask your healthcare provider to call the local or state health department.  Monitor your symptoms Seek prompt medical attention if your illness is worsening (e.g., difficulty breathing). Before going to your medical appointment, call the healthcare provider and tell them that you have, or are being evaluated for, COVID-19 infection. Ask your healthcare provider to call the local or state health department.  Wear a facemask You should wear a facemask that covers your nose and mouth when you are in the same room with other people and when you visit a healthcare provider. People who live with or visit you should also wear a facemask while they are in the same room with you.  Separate yourself from other people in  your home As much as possible, you should stay in a different room from other people in your home. Also, you should use a separate bathroom, if available.  Avoid sharing household items You should not share dishes, drinking glasses, cups, eating utensils, towels, bedding, or other items with other people in your home. After using these items, you should wash them thoroughly with soap and water.  Cover your coughs and sneezes Cover your mouth and nose with a tissue when you cough or sneeze, or you can cough or sneeze into your sleeve. Throw used tissues in a lined trash can, and immediately wash your hands with soap and water for at least 20 seconds or use an alcohol-based hand rub.  Wash your [employer] your hands often and thoroughly with soap and water for at least 20 seconds. You can use an alcohol-based hand sanitizer if soap and water are not available and if your hands are not visibly dirty. Avoid touching your eyes, nose, and mouth with unwashed hands.   Prevention Steps for Caregivers and Household Members of Individuals Confirmed to have, or Being Evaluated for, COVID-19 Infection Being Cared for in the Home  If you live with, or provide care at home for, a person confirmed to have, or being evaluated for, COVID-19 infection please follow these guidelines to prevent infection:  Follow healthcare provider's instructions Make sure that you understand and can help the patient follow any healthcare provider instructions for all care.  Provide for the patient's basic needs You should help the patient with basic needs in the home and provide support for getting groceries, prescriptions,  and other personal needs.  Monitor the patient's symptoms If they are getting sicker, call his or her medical provider and tell them that the patient has, or is being evaluated for, COVID-19 infection. This will help the healthcare provider's office take steps to keep other people from getting  infected. Ask the healthcare provider to call the local or state health department.  Limit the number of people who have contact with the patient  If possible, have only one caregiver for the patient.  Other household members should stay in another home or place of residence. If this is not possible, they should stay  in another room, or be separated from the patient as much as possible. Use a separate bathroom, if available.  Restrict visitors who do not have an essential need to be in the home.  Keep older adults, very young children, and other sick people away from the patient Keep older adults, very young children, and those who have compromised immune systems or chronic health conditions away from the patient. This includes people with chronic heart, lung, or kidney conditions, diabetes, and cancer.  Ensure good ventilation Make sure that shared spaces in the home have good air flow, such as from an air conditioner or an opened window, weather permitting.  Wash your hands often  Wash your hands often and thoroughly with soap and water for at least 20 seconds. You can use an alcohol based hand sanitizer if soap and water are not available and if your hands are not visibly dirty.  Avoid touching your eyes, nose, and mouth with unwashed hands.  Use disposable paper towels to dry your hands. If not available, use dedicated cloth towels and replace them when they become wet.  Wear a facemask and gloves  Wear a disposable facemask at all times in the room and gloves when you touch or have contact with the patient's blood, body fluids, and/or secretions or excretions, such as sweat, saliva, sputum, nasal mucus, vomit, urine, or feces.  Ensure the mask fits over your nose and mouth tightly, and do not touch it during use.  Throw out disposable facemasks and gloves after using them. Do not reuse.  Wash your hands immediately after removing your facemask and gloves.  If your personal  clothing becomes contaminated, carefully remove clothing and launder. Wash your hands after handling contaminated clothing.  Place all used disposable facemasks, gloves, and other waste in a lined container before disposing them with other household waste.  Remove gloves and wash your hands immediately after handling these items.  Do not share dishes, glasses, or other household items with the patient  Avoid sharing household items. You should not share dishes, drinking glasses, cups, eating utensils, towels, bedding, or other items with a patient who is confirmed to have, or being evaluated for, COVID-19 infection.  After the person uses these items, you should wash them thoroughly with soap and water.  Wash laundry thoroughly  Immediately remove and wash clothes or bedding that have blood, body fluids, and/or secretions or excretions, such as sweat, saliva, sputum, nasal mucus, vomit, urine, or feces, on them.  Wear gloves when handling laundry from the patient.  Read and follow directions on labels of laundry or clothing items and detergent. In general, wash and dry with the warmest temperatures recommended on the label.  Clean all areas the individual has used often  Clean all touchable surfaces, such as counters, tabletops, doorknobs, bathroom fixtures, toilets, phones, keyboards, tablets, and bedside tables, every day.  Also, clean any surfaces that may have blood, body fluids, and/or secretions or excretions on them.  Wear gloves when cleaning surfaces the patient has come in contact with.  Use a diluted bleach solution (e.g., dilute bleach with 1 part bleach and 10 parts water) or a household disinfectant with a label that says EPA-registered for coronaviruses. To make a bleach solution at home, add 1 tablespoon of bleach to 1 quart (4 cups) of water. For a larger supply, add  cup of bleach to 1 gallon (16 cups) of water.  Read labels of cleaning products and follow  recommendations provided on product labels. Labels contain instructions for safe and effective use of the cleaning product including precautions you should take when applying the product, such as wearing gloves or eye protection and making sure you have good ventilation during use of the product.  Remove gloves and wash hands immediately after cleaning.  Monitor yourself for signs and symptoms of illness Caregivers and household members are considered close contacts, should monitor their health, and will be asked to limit movement outside of the home to the extent possible. Follow the monitoring steps for close contacts listed on the symptom monitoring form.   ? If you have additional questions, contact your local health department or call the epidemiologist on call at [phone] (available 24/7). ? This guidance is subject to change. For the most up-to-date guidance from [facility], please refer to their website: TripMetro.hu    Information on my medicine - ELIQUIS (apixaban)  Why was Eliquis prescribed for you? Eliquis was prescribed to treat blood clots that may have been found in the veins of your legs (deep vein thrombosis) or in your lungs (pulmonary embolism) and to reduce the risk of them occurring again.  What do You need to know about Eliquis ? The dose is reduced to ONE 5 mg tablet taken TWICE daily.  Eliquis may be taken with or without food.   Try to take the dose about the same time in the morning and in the evening. If you have difficulty swallowing the tablet whole please discuss with your pharmacist how to take the medication safely.  Take Eliquis exactly as prescribed and DO NOT stop taking Eliquis without talking to the doctor who prescribed the medication.  Stopping may increase your risk of developing a new blood clot.  Refill your prescription before you run out.  After discharge, you should have  regular check-up appointments with your healthcare provider that is prescribing your Eliquis.    What do you do if you miss a dose? If a dose of ELIQUIS is not taken at the scheduled time, take it as soon as possible on the same day and twice-daily administration should be resumed. The dose should not be doubled to make up for a missed dose.  Important Safety Information A possible side effect of Eliquis is bleeding. You should call your healthcare provider right away if you experience any of the following: ? Bleeding from an injury or your nose that does not stop. ? Unusual colored urine (red or dark brown) or unusual colored stools (red or black). ? Unusual bruising for unknown reasons. ? A serious fall or if you hit your head (even if there is no bleeding).  Some medicines may interact with Eliquis and might increase your risk of bleeding or clotting while on Eliquis. To help avoid this, consult your healthcare provider or pharmacist prior to using any new prescription or non-prescription medications, including herbals, vitamins, non-steroidal  anti-inflammatory drugs (NSAIDs) and supplements.  This website has more information on Eliquis (apixaban): http://www.eliquis.com/eliquis/home

## 2020-03-23 NOTE — Discharge Summary (Signed)
Discharge Summary  Patient ID: Patricia Atkinson [id number] 59 y.o. 18-Apr-1961  Admit date: 03/17/2020  Discharge date and time: 03/22/2020  5:52 PM   Admitting Physician: Leonie Douglas, MD   Discharge Physician: same  Admission Diagnoses: Arterial embolism of right leg (HCC) [I74.3] Atrial fibrillation with RVR (HCC) [I48.91] Acute on chronic congestive heart failure, unspecified heart failure type (HCC) [I50.9] COVID-19 virus detected [U07.1] Thrombus [I82.90] Atrial fibrillation (HCC) [I48.91]  Discharge Diagnoses: same  Admission Condition: fair  Discharged Condition: fair  Indication for Admission: Acute right lower extremity ischemia  Hospital Course: Ms. Patricia Atkinson is a 59 year old female who presented to the emergency department with acute right lower extremity ischemia.  She was brought emergently to the operating room.  She tested positive for COVID-19 and thus was operated on under the necessary Covid precautions.  She was admitted to the hospital postoperatively.  Throughout her hospital stay she maintained a well-perfused right lower extremity.  The hospitalist team was also consulted for management of COVID-19 as well as CHF exacerbation while hospitalized.  Cardiology was also also consulted for management of CHF and and new onset atrial fibrillation.  Changes in medications are reflected in the below medication list.  She will follow up with Dr. Lenell Antu in 1 month with an arterial duplex and ABI.  She will follow up with Cardiology for management of atrial fibrillation and CHF.  She will also follow up with her PCP for monitoring of respiratory status and home oxygen management.  She was discharged with 2-3 days of narcotic pain medication for continued post operative pain control.  She was discharged in stable condition.  Consults: cardiology and hospitalist medicine  Treatments: surgery: RLE thrombectomy by Dr. Lenell Antu 03/17/20  Discharge Exam: See progress  note 03/22/20 Vitals:   03/22/20 0840 03/22/20 1100  BP:  136/70  Pulse:  (!) 53  Resp:  18  Temp:  97.8 F (36.6 C)  SpO2: 92% 99%     Disposition: Discharge disposition: 01-Home or Self Care       Patient Instructions:  Allergies as of 03/22/2020   No Known Allergies     Medication List    STOP taking these medications   lisinopril 20 MG tablet Commonly known as: ZESTRIL     TAKE these medications   albuterol 108 (90 Base) MCG/ACT inhaler Commonly known as: VENTOLIN HFA Inhale 2 puffs into the lungs every 4 (four) hours as needed for wheezing or shortness of breath.   apixaban 5 MG Tabs tablet Commonly known as: ELIQUIS Take 1 tablet (5 mg total) by mouth 2 (two) times daily.   aspirin 81 MG EC tablet Take 1 tablet (81 mg total) by mouth daily at 6 (six) AM. Swallow whole.   carvedilol 12.5 MG tablet Commonly known as: COREG Take 12.5 mg by mouth 2 (two) times daily with a meal.   furosemide 40 MG tablet Commonly known as: LASIX Take 40-80 mg by mouth See admin instructions. 80 MG in the morning and 40 MG in the evening   losartan 25 MG tablet Commonly known as: COZAAR Take 1 tablet (25 mg total) by mouth daily.   oxyCODONE 5 MG immediate release tablet Commonly known as: Oxy IR/ROXICODONE Take 1 tablet (5 mg total) by mouth every 6 (six) hours as needed for moderate pain.   simvastatin 40 MG tablet Commonly known as: ZOCOR Take 40 mg by mouth daily.   spironolactone 25 MG tablet Commonly known as: ALDACTONE Take 25 mg by  mouth daily.      Activity: activity as tolerated Diet: regular diet Wound Care: keep wound clean and dry  Follow-up with Dr. Lenell Antu in 1 month.  Signed: Emilie Rutter, PA-C 03/23/2020 3:37 PM [facility] Office: [phone]

## 2020-03-24 NOTE — Anesthesia Postprocedure Evaluation (Signed)
Anesthesia Post Note  Patient: Andrea Shartzer  Procedure(s) Performed: ILIO-FEMORAL AND TIBIAL THROMBECTOMY WITH FOUR COMPARTMENT FASCIOTOMY (Right Leg Upper)     Patient location during evaluation: PACU Anesthesia Type: General Level of consciousness: awake and alert Pain management: pain level controlled Vital Signs Assessment: post-procedure vital signs reviewed and stable Respiratory status: spontaneous breathing, nonlabored ventilation, respiratory function stable and patient connected to nasal cannula oxygen Cardiovascular status: blood pressure returned to baseline and stable Postop Assessment: no apparent nausea or vomiting Anesthetic complications: no   No complications documented.  Last Vitals:  Vitals:   03/22/20 0840 03/22/20 1100  BP:  136/70  Pulse:  (!) 53  Resp:  18  Temp:  36.6 C  SpO2: 92% 99%    Last Pain:  Vitals:   03/22/20 1325  TempSrc:   PainSc: 2                  Tarvaris Puglia

## 2020-03-28 ENCOUNTER — Telehealth: Payer: Self-pay

## 2020-03-28 NOTE — Telephone Encounter (Signed)
Telephone call received from Glenford Peers. At [facility] called after pt's initial visit on today. Pt's mother reports pt came home from hospital with 3 toes on right foot purple with black tips and wanted to make sure this wasn't anything new for patient. Denies pt having changes in appearance, pain or temperature since discharge. Noted pt to have right lower extremity ischemia but no description of toes. Will reach out to Dr. Lenell Antu to verify if this is a change in pt's condition.   Carney Bern also wanted to verify pt's weight bearing status. Advised per dc summary, activity as tolerated.   Per Dr. Lenell Antu this is is a change for patient and recommend an office visit with ABI and LE duplex when medically safe after positive covid-19. Informed Carney Bern with [facility] agency of recommendations and that  someone will contact pt regarding appt. Verbalized understanding.

## 2020-03-29 NOTE — Progress Notes (Signed)
Received message from [facility] transporation that patient stated that she did not need transportation tomorrow because she did not have an appointment. Spoke with patient at [phone] to discuss her post-covid appointment that was scheduled last week for tomorrow. Patient stated that she has another appointment tomorrow afternoon for her physical therapy. Provided patient with phone number for post covid care center in order to call and reschedule her appointment. Spoke with transportation to advise that patient does not need transportation for tomorrow.   Hortencia Conradi, RN MSN CCM Transitions of Care [phone]

## 2020-03-30 ENCOUNTER — Ambulatory Visit: Payer: Medicare Other

## 2020-03-31 ENCOUNTER — Other Ambulatory Visit (HOSPITAL_COMMUNITY): Payer: Self-pay | Admitting: Vascular Surgery

## 2020-03-31 ENCOUNTER — Other Ambulatory Visit: Payer: Self-pay

## 2020-03-31 ENCOUNTER — Ambulatory Visit (INDEPENDENT_AMBULATORY_CARE_PROVIDER_SITE_OTHER)
Admission: RE | Admit: 2020-03-31 | Discharge: 2020-03-31 | Disposition: A | Discharge status: Home / Self Care | Payer: Medicare Other | Source: Ambulatory Visit | Attending: Vascular Surgery | Admitting: Vascular Surgery

## 2020-03-31 ENCOUNTER — Ambulatory Visit (HOSPITAL_COMMUNITY)
Admission: RE | Admit: 2020-03-31 | Discharge: 2020-03-31 | Disposition: A | Discharge status: Home / Self Care | Payer: Medicare Other | Source: Ambulatory Visit | Attending: Vascular Surgery | Admitting: Vascular Surgery

## 2020-03-31 DIAGNOSIS — L819 Disorder of pigmentation, unspecified: Secondary | ICD-10-CM | POA: Insufficient documentation

## 2020-04-05 ENCOUNTER — Ambulatory Visit (INDEPENDENT_AMBULATORY_CARE_PROVIDER_SITE_OTHER): Payer: Self-pay | Admitting: Vascular Surgery

## 2020-04-05 ENCOUNTER — Encounter: Payer: Self-pay | Admitting: Vascular Surgery

## 2020-04-05 ENCOUNTER — Other Ambulatory Visit: Payer: Self-pay

## 2020-04-05 VITALS — BP 181/81 | HR 71 | Temp 97.7°F | Resp 20 | Ht 66.0 in | Wt 199.0 lb

## 2020-04-05 DIAGNOSIS — I96 Gangrene, not elsewhere classified: Secondary | ICD-10-CM

## 2020-04-05 NOTE — H&P (View-Only) (Signed)
ASSESSMENT & PLAN:  59 y.o. female with dry gangrene of right toes 3-5 after RLE arterial thrombectomy 03/17/20. Her non-invasive studies suggest digital / pedal disease, but I counseled her that we may find a problem upstream which could improve healing. Her risk for toe amputation is high. She is interested in doing everything she can to avoid this, so I have offered her angiography.   Plan RLE angiogram via LCFA access tomorrow in cath lab.  CHIEF COMPLAINT:   Right third gangrene  HISTORY:  HISTORY OF PRESENT ILLNESS: Patricia Atkinson is a 59 y.o. female who underwent arterial thrombectomy and fasciotomies of her right lower extremity 03/17/20 for acute limb ischemia. Of note she was COVID (+) at the time. She did very well postoperatively. She was discharged after a typical postoperative course. At home she developed darkening of her third, fourth and fifth toes. She presents today for evaluation. She has been ambulatory without pain.   Past Medical History:  Diagnosis Date  . Heart failure (HCC)    diagnosed around 2017 in [place]  . Hyperlipidemia   . Peripheral arterial disease [facility](HCC)     Past Surgical History:  Procedure Laterality Date  . THROMBECTOMY OF BYPASS GRAFT FEMORAL- TIBIAL ARTERY Right 03/17/2020   Procedure: ILIO-FEMORAL AND TIBIAL THROMBECTOMY WITH FOUR COMPARTMENT FASCIOTOMY;  Surgeon: Leonie DouglasHawken, Meerab Maselli N, MD;  [facility];  Service: Vascular;  Laterality: Right;    Family History  Problem Relation Age of Onset  . Heart attack Father   . Heart attack Paternal Grandmother     Social History   Socioeconomic History  . Marital status: Widowed    Spouse name: Not on file  . Number of children: Not on file  . Years of education: Not on file  . Highest education level: Not on file  Occupational History  . Not on file  Tobacco Use  . Smoking status: Never Smoker  . Smokeless tobacco: Never Used  Vaping Use  . Vaping Use: Never used  Substance and  Sexual Activity  . Alcohol use: Yes    Comment: socially  . Drug use: Never  . Sexual activity: Not on file  Other Topics Concern  . Not on file  Social History Narrative  . Not on file   Social Determinants of Health   Financial Resource Strain: Not on file  Food Insecurity: Not on file  Transportation Needs: Not on file  Physical Activity: Not on file  Stress: Not on file  Social Connections: Not on file  Intimate Partner Violence: Not on file    No Known Allergies  Current Outpatient Medications  Medication Sig Dispense Refill  . albuterol (VENTOLIN HFA) 108 (90 Base) MCG/ACT inhaler Inhale 2 puffs into the lungs every 4 (four) hours as needed for wheezing or shortness of breath. 6.7 g 1  . apixaban (ELIQUIS) 5 MG TABS tablet Take 1 tablet (5 mg total) by mouth 2 (two) times daily. 60 tablet 2  . aspirin EC 81 MG EC tablet Take 1 tablet (81 mg total) by mouth daily at 6 (six) AM. Swallow whole. 30 tablet 11  . carvedilol (COREG) 12.5 MG tablet Take 12.5 mg by mouth 2 (two) times daily with a meal.    . furosemide (LASIX) 40 MG tablet Take 40-80 mg by mouth See admin instructions. 80 MG in the morning and 40 MG in the evening    . losartan (COZAAR) 25 MG tablet Take 1 tablet (25 mg total) by mouth  daily. 30 tablet 1  . oxyCODONE (OXY IR/ROXICODONE) 5 MG immediate release tablet Take 1 tablet (5 mg total) by mouth every 6 (six) hours as needed for moderate pain. 20 tablet 0  . simvastatin (ZOCOR) 40 MG tablet Take 40 mg by mouth daily.    Marland Kitchen spironolactone (ALDACTONE) 25 MG tablet Take 25 mg by mouth daily.     No current facility-administered medications for this visit.    REVIEW OF SYSTEMS:  [X]  denotes positive finding, [ ]  denotes negative finding Cardiac  Comments:  Chest pain or chest pressure:    Shortness of breath upon exertion:    Short of breath when lying flat:    Irregular heart rhythm:        Vascular    Pain in calf, thigh, or hip brought on by  ambulation:    Pain in feet at night that wakes you up from your sleep:     Blood clot in your veins:    Leg swelling:         Pulmonary    Oxygen at home:    Productive cough:     Wheezing:         Neurologic    Sudden weakness in arms or legs:     Sudden numbness in arms or legs:     Sudden onset of difficulty speaking or slurred speech:    Temporary loss of vision in one eye:     Problems with dizziness:         Gastrointestinal    Blood in stool:     Vomited blood:         Genitourinary    Burning when urinating:     Blood in urine:        Psychiatric    Major depression:         Hematologic    Bleeding problems:    Problems with blood clotting too easily:        Skin    Rashes or ulcers:        Constitutional    Fever or chills:     PHYSICAL EXAM:   Vitals:   04/05/20 1449  BP: (!) 181/81  Pulse: 71  Resp: 20  Temp: 97.7 F (36.5 C)  SpO2: 99%  Weight: 199 lb (90.3 kg)  Height: 5\' 6"  (1.676 m)    Constitutional: Well appearing in no distress. Appears well nourished.  Neurologic: Normal gait and station. CN intact. No weakness. No sensory loss. Psychiatric: Mood and affect symmetric and appropriate. Eyes: No icterus. No conjunctival pallor. Ears, nose, throat: mucous membranes moist. Midline trachea. No carotid bruit. Cardiac: regular rate and rhythm.  Respiratory: unlabored. Abdominal: soft, non-tender, non-distended. No palpable pulsatile abdominal mass. Peripheral vascular:  No palpable pedal pulses  Incisions healing well RLE  Extremity: No edema. No cyanosis. No pallor.  Skin: Dry gangrene of right toe tips 3-5. No ulceration.  Lymphatic: No Stemmer's sign. No palpable lymphadenopathy.   DATA REVIEW:    Most recent CBC CBC Latest Ref Rng & Units 03/20/2020 03/19/2020 03/18/2020  WBC 4.0 - 10.5 K/uL 9.9 10.1 7.4  Hemoglobin 12.0 - 15.0 g/dL 03/22/2020) 10.8(L) 9.7(L)  Hematocrit 36.0 - 46.0 % 29.8(L) 34.1(L) 30.8(L)  Platelets 150 - 400  K/uL 310 324 253     Most recent CMP CMP Latest Ref Rng & Units 03/22/2020 03/21/2020 03/20/2020  Glucose 70 - 99 mg/dL 03/24/2020) 03/23/2020) 03/22/2020)  BUN 8 - 23 mg/dL 924(Q) 683(M) 196(Q)  Creatinine 0.44 - 1.00 mg/dL 4.23(N) 3.61(W) 4.31  Sodium 135 - 145 mmol/L 138 139 139  Potassium 3.5 - 5.1 mmol/L 4.3 4.0 4.2  Chloride 98 - 111 mmol/L 106 108 109  CO2 22 - 32 mmol/L 20(L) 23 21(L)  Calcium 8.9 - 10.3 mg/dL 5.4(M) 0.8(Q) 7.7(L)  Total Protein 6.5 - 8.1 g/dL 7.6(P) 9.5(K) 9.3(O)  Total Bilirubin 0.3 - 1.2 mg/dL 1.0 0.9 1.0  Alkaline Phos 38 - 126 U/L 195(H) 147(H) 163(H)  AST 15 - 41 U/L 58(H) 59(H) 47(H)  ALT 0 - 44 U/L 42 36 24    Renal function Estimated Creatinine Clearance: 62.5 mL/min (A) (by C-G formula based on SCr of 1.07 mg/dL (H)).  Hgb A1c MFr Bld (%)  Date Value  03/18/2020 6.0 (H)    LDL Cholesterol  Date Value Ref Range Status  03/18/2020 121 (H) 0 - 99 mg/dL Final    Comment:           Total Cholesterol/HDL:CHD Risk Coronary Heart Disease Risk Table                     Men   Women  1/2 Average Risk   3.4   3.3  Average Risk       5.0   4.4  2 X Average Risk   9.6   7.1  3 X Average Risk  23.4   11.0        Use the calculated Patient Ratio above and the CHD Risk Table to determine the patient's CHD Risk.        ATP III CLASSIFICATION (LDL):  <100     mg/dL   Optimal  671-245  mg/dL   Near or Above                    Optimal  130-159  mg/dL   Borderline  809-983  mg/dL   High  >382     mg/dL   Very High Performed at [facility], [address] [place], [place] [address]      LOWER EXTREMITY ARTERIAL DUPLEX    Indications: Indications:03/28/20 New symptom of blue discoloration of  digits 3,        4, and 5.   High Risk Factors: Diabetes.   Other Factors: Other Factors: 03/17/20 Diagnosis of Afib leading to  embolus of         RLE causing acute ischemia.           Vascular Interventions:03/17/20 Iliofemoral and  tibial         thrombectomy and 4 compartment fasciotomy.  Current ABI: R 1.24   Comparison Study: none   Performing Technologist: Jeb Levering RDMS, RVT     Examination Guidelines: A complete evaluation includes B-mode imaging,  spectral  Doppler, color Doppler, and power Doppler as needed of all accessible  portions  of each vessel. Bilateral testing is considered an integral part of a  complete  examination. Limited examinations for reoccurring indications may be  performed  as noted.     +-----------+--------+-----+--------+---------+--------+  RIGHT   PSV cm/sRatioStenosisWaveform Comments  +-----------+--------+-----+--------+---------+--------+  CFA Distal 193          triphasic      +-----------+--------+-----+--------+---------+--------+  DFA    174          triphasic      +-----------+--------+-----+--------+---------+--------+  SFA Prox  177          triphasic      +-----------+--------+-----+--------+---------+--------+  SFA Mid  133          triphasic      +-----------+--------+-----+--------+---------+--------+  SFA Distal 110          triphasic      +-----------+--------+-----+--------+---------+--------+  POP Prox  98          biphasic       +-----------+--------+-----+--------+---------+--------+  POP Distal 95          biphasic       +-----------+--------+-----+--------+---------+--------+  ATA Distal 71          biphasic       +-----------+--------+-----+--------+---------+--------+  PTA Distal 92          biphasic       +-----------+--------+-----+--------+---------+--------+  PERO Distal86          biphasic       +-----------+--------+-----+--------+---------+--------+            Summary:  Right: Normal examination. No  evidence of arterial occlusive disease.     See table(s) above for measurements and observations.      Electronically signed by Fabienne Bruns MD on 03/31/2020 at 4:17:39 PM.   LOWER EXTREMITY DOPPLER STUDY   Indications: 03/28/20 New symptom of blue discoloration of digits 3, 4, and  5.   High Risk Factors: Diabetes.   Other Factors: 03/17/20 Diagnosis of Afib leading to embolus of RLE  causing         acute ischemia.  Vascular Interventions: 03/17/20 Iliofemoral and tibial thrombectomy and  4             compartment fasciotomy.   Performing Technologist: Jeb Levering RDMS, RVT     Examination Guidelines: A complete evaluation includes at minimum, Doppler  waveform signals and systolic blood pressure reading at the level of  bilateral  brachial, anterior tibial, and posterior tibial arteries, when vessel  segments  are accessible. Bilateral testing is considered an integral part of a  complete  examination. Photoelectric Plethysmograph (PPG) waveforms and toe systolic  pressure readings are included as required and additional duplex testing  as  needed. Limited examinations for reoccurring indications may be performed  as  noted.     ABI Findings:  +---------+------------------+-----+--------+--------+  Right  Rt Pressure (mmHg)IndexWaveformComment   +---------+------------------+-----+--------+--------+  Brachial 177                     +---------+------------------+-----+--------+--------+  ATA   152        0.85 biphasic      +---------+------------------+-----+--------+--------+  PTA   221        1.24 biphasic      +---------+------------------+-----+--------+--------+  Great Toe0         0.00 Absent       +---------+------------------+-----+--------+--------+   +---------+------------------+-----+---------+-------+  Left   Lt Pressure  (mmHg)IndexWaveform Comment  +---------+------------------+-----+---------+-------+  Brachial 178                      +---------+------------------+-----+---------+-------+  ATA   210        1.18 triphasic      +---------+------------------+-----+---------+-------+  PTA   212        1.19 triphasic      +---------+------------------+-----+---------+-------+  Great Toe147        0.83 Normal        +---------+------------------+-----+---------+-------+         Summary:  Right: Resting right ankle-brachial index is within normal range. No  evidence of significant right lower extremity arterial disease. The  right  toe-brachial index is abnormal.   Left: Resting left ankle-brachial index is within normal range. No  evidence of significant left lower extremity arterial disease. The left  toe-brachial index is normal.      *See table(s) above for measurements and observations.   Kaylum Shrum N. Mone Commisso, MD [facility] Office Phone Number: [phone] 04/05/2020 5:21 PM    

## 2020-04-05 NOTE — Progress Notes (Signed)
ASSESSMENT & PLAN:  59 y.o. female with dry gangrene of right toes 3-5 after RLE arterial thrombectomy 03/17/20. Her non-invasive studies suggest digital / pedal disease, but I counseled her that we may find a problem upstream which could improve healing. Her risk for toe amputation is high. She is interested in doing everything she can to avoid this, so I have offered her angiography.   Plan RLE angiogram via LCFA access tomorrow in cath lab.  CHIEF COMPLAINT:   Right third gangrene  HISTORY:  HISTORY OF PRESENT ILLNESS: Patricia Atkinson is a 59 y.o. female who underwent arterial thrombectomy and fasciotomies of her right lower extremity 03/17/20 for acute limb ischemia. Of note she was COVID (+) at the time. She did very well postoperatively. She was discharged after a typical postoperative course. At home she developed darkening of her third, fourth and fifth toes. She presents today for evaluation. She has been ambulatory without pain.   Past Medical History:  Diagnosis Date  . Heart failure (HCC)    diagnosed around 2017 in [place]  . Hyperlipidemia   . Peripheral arterial disease [facility](HCC)     Past Surgical History:  Procedure Laterality Date  . THROMBECTOMY OF BYPASS GRAFT FEMORAL- TIBIAL ARTERY Right 03/17/2020   Procedure: ILIO-FEMORAL AND TIBIAL THROMBECTOMY WITH FOUR COMPARTMENT FASCIOTOMY;  Surgeon: Leonie DouglasHawken, Radwan Cowley N, MD;  [facility];  Service: Vascular;  Laterality: Right;    Family History  Problem Relation Age of Onset  . Heart attack Father   . Heart attack Paternal Grandmother     Social History   Socioeconomic History  . Marital status: Widowed    Spouse name: Not on file  . Number of children: Not on file  . Years of education: Not on file  . Highest education level: Not on file  Occupational History  . Not on file  Tobacco Use  . Smoking status: Never Smoker  . Smokeless tobacco: Never Used  Vaping Use  . Vaping Use: Never used  Substance and  Sexual Activity  . Alcohol use: Yes    Comment: socially  . Drug use: Never  . Sexual activity: Not on file  Other Topics Concern  . Not on file  Social History Narrative  . Not on file   Social Determinants of Health   Financial Resource Strain: Not on file  Food Insecurity: Not on file  Transportation Needs: Not on file  Physical Activity: Not on file  Stress: Not on file  Social Connections: Not on file  Intimate Partner Violence: Not on file    No Known Allergies  Current Outpatient Medications  Medication Sig Dispense Refill  . albuterol (VENTOLIN HFA) 108 (90 Base) MCG/ACT inhaler Inhale 2 puffs into the lungs every 4 (four) hours as needed for wheezing or shortness of breath. 6.7 g 1  . apixaban (ELIQUIS) 5 MG TABS tablet Take 1 tablet (5 mg total) by mouth 2 (two) times daily. 60 tablet 2  . aspirin EC 81 MG EC tablet Take 1 tablet (81 mg total) by mouth daily at 6 (six) AM. Swallow whole. 30 tablet 11  . carvedilol (COREG) 12.5 MG tablet Take 12.5 mg by mouth 2 (two) times daily with a meal.    . furosemide (LASIX) 40 MG tablet Take 40-80 mg by mouth See admin instructions. 80 MG in the morning and 40 MG in the evening    . losartan (COZAAR) 25 MG tablet Take 1 tablet (25 mg total) by mouth  daily. 30 tablet 1  . oxyCODONE (OXY IR/ROXICODONE) 5 MG immediate release tablet Take 1 tablet (5 mg total) by mouth every 6 (six) hours as needed for moderate pain. 20 tablet 0  . simvastatin (ZOCOR) 40 MG tablet Take 40 mg by mouth daily.    Marland Kitchen spironolactone (ALDACTONE) 25 MG tablet Take 25 mg by mouth daily.     No current facility-administered medications for this visit.    REVIEW OF SYSTEMS:  [X]  denotes positive finding, [ ]  denotes negative finding Cardiac  Comments:  Chest pain or chest pressure:    Shortness of breath upon exertion:    Short of breath when lying flat:    Irregular heart rhythm:        Vascular    Pain in calf, thigh, or hip brought on by  ambulation:    Pain in feet at night that wakes you up from your sleep:     Blood clot in your veins:    Leg swelling:         Pulmonary    Oxygen at home:    Productive cough:     Wheezing:         Neurologic    Sudden weakness in arms or legs:     Sudden numbness in arms or legs:     Sudden onset of difficulty speaking or slurred speech:    Temporary loss of vision in one eye:     Problems with dizziness:         Gastrointestinal    Blood in stool:     Vomited blood:         Genitourinary    Burning when urinating:     Blood in urine:        Psychiatric    Major depression:         Hematologic    Bleeding problems:    Problems with blood clotting too easily:        Skin    Rashes or ulcers:        Constitutional    Fever or chills:     PHYSICAL EXAM:   Vitals:   04/05/20 1449  BP: (!) 181/81  Pulse: 71  Resp: 20  Temp: 97.7 F (36.5 C)  SpO2: 99%  Weight: 199 lb (90.3 kg)  Height: 5\' 6"  (1.676 m)    Constitutional: Well appearing in no distress. Appears well nourished.  Neurologic: Normal gait and station. CN intact. No weakness. No sensory loss. Psychiatric: Mood and affect symmetric and appropriate. Eyes: No icterus. No conjunctival pallor. Ears, nose, throat: mucous membranes moist. Midline trachea. No carotid bruit. Cardiac: regular rate and rhythm.  Respiratory: unlabored. Abdominal: soft, non-tender, non-distended. No palpable pulsatile abdominal mass. Peripheral vascular:  No palpable pedal pulses  Incisions healing well RLE  Extremity: No edema. No cyanosis. No pallor.  Skin: Dry gangrene of right toe tips 3-5. No ulceration.  Lymphatic: No Stemmer's sign. No palpable lymphadenopathy.   DATA REVIEW:    Most recent CBC CBC Latest Ref Rng & Units 03/20/2020 03/19/2020 03/18/2020  WBC 4.0 - 10.5 K/uL 9.9 10.1 7.4  Hemoglobin 12.0 - 15.0 g/dL 03/22/2020) 10.8(L) 9.7(L)  Hematocrit 36.0 - 46.0 % 29.8(L) 34.1(L) 30.8(L)  Platelets 150 - 400  K/uL 310 324 253     Most recent CMP CMP Latest Ref Rng & Units 03/22/2020 03/21/2020 03/20/2020  Glucose 70 - 99 mg/dL 03/24/2020) 03/23/2020) 03/22/2020)  BUN 8 - 23 mg/dL 924(Q) 683(M) 196(Q)  Creatinine 0.44 - 1.00 mg/dL 4.23(N) 3.61(W) 4.31  Sodium 135 - 145 mmol/L 138 139 139  Potassium 3.5 - 5.1 mmol/L 4.3 4.0 4.2  Chloride 98 - 111 mmol/L 106 108 109  CO2 22 - 32 mmol/L 20(L) 23 21(L)  Calcium 8.9 - 10.3 mg/dL 5.4(M) 0.8(Q) 7.7(L)  Total Protein 6.5 - 8.1 g/dL 7.6(P) 9.5(K) 9.3(O)  Total Bilirubin 0.3 - 1.2 mg/dL 1.0 0.9 1.0  Alkaline Phos 38 - 126 U/L 195(H) 147(H) 163(H)  AST 15 - 41 U/L 58(H) 59(H) 47(H)  ALT 0 - 44 U/L 42 36 24    Renal function Estimated Creatinine Clearance: 62.5 mL/min (A) (by C-G formula based on SCr of 1.07 mg/dL (H)).  Hgb A1c MFr Bld (%)  Date Value  03/18/2020 6.0 (H)    LDL Cholesterol  Date Value Ref Range Status  03/18/2020 121 (H) 0 - 99 mg/dL Final    Comment:           Total Cholesterol/HDL:CHD Risk Coronary Heart Disease Risk Table                     Men   Women  1/2 Average Risk   3.4   3.3  Average Risk       5.0   4.4  2 X Average Risk   9.6   7.1  3 X Average Risk  23.4   11.0        Use the calculated Patient Ratio above and the CHD Risk Table to determine the patient's CHD Risk.        ATP III CLASSIFICATION (LDL):  <100     mg/dL   Optimal  671-245  mg/dL   Near or Above                    Optimal  130-159  mg/dL   Borderline  809-983  mg/dL   High  >382     mg/dL   Very High Performed at [facility], [address] [place], [place] [address]      LOWER EXTREMITY ARTERIAL DUPLEX    Indications: Indications:03/28/20 New symptom of blue discoloration of  digits 3,        4, and 5.   High Risk Factors: Diabetes.   Other Factors: Other Factors: 03/17/20 Diagnosis of Afib leading to  embolus of         RLE causing acute ischemia.           Vascular Interventions:03/17/20 Iliofemoral and  tibial         thrombectomy and 4 compartment fasciotomy.  Current ABI: R 1.24   Comparison Study: none   Performing Technologist: Jeb Levering RDMS, RVT     Examination Guidelines: A complete evaluation includes B-mode imaging,  spectral  Doppler, color Doppler, and power Doppler as needed of all accessible  portions  of each vessel. Bilateral testing is considered an integral part of a  complete  examination. Limited examinations for reoccurring indications may be  performed  as noted.     +-----------+--------+-----+--------+---------+--------+  RIGHT   PSV cm/sRatioStenosisWaveform Comments  +-----------+--------+-----+--------+---------+--------+  CFA Distal 193          triphasic      +-----------+--------+-----+--------+---------+--------+  DFA    174          triphasic      +-----------+--------+-----+--------+---------+--------+  SFA Prox  177          triphasic      +-----------+--------+-----+--------+---------+--------+  SFA Mid  133          triphasic      +-----------+--------+-----+--------+---------+--------+  SFA Distal 110          triphasic      +-----------+--------+-----+--------+---------+--------+  POP Prox  98          biphasic       +-----------+--------+-----+--------+---------+--------+  POP Distal 95          biphasic       +-----------+--------+-----+--------+---------+--------+  ATA Distal 71          biphasic       +-----------+--------+-----+--------+---------+--------+  PTA Distal 92          biphasic       +-----------+--------+-----+--------+---------+--------+  PERO Distal86          biphasic       +-----------+--------+-----+--------+---------+--------+            Summary:  Right: Normal examination. No  evidence of arterial occlusive disease.     See table(s) above for measurements and observations.      Electronically signed by Fabienne Bruns MD on 03/31/2020 at 4:17:39 PM.   LOWER EXTREMITY DOPPLER STUDY   Indications: 03/28/20 New symptom of blue discoloration of digits 3, 4, and  5.   High Risk Factors: Diabetes.   Other Factors: 03/17/20 Diagnosis of Afib leading to embolus of RLE  causing         acute ischemia.  Vascular Interventions: 03/17/20 Iliofemoral and tibial thrombectomy and  4             compartment fasciotomy.   Performing Technologist: Jeb Levering RDMS, RVT     Examination Guidelines: A complete evaluation includes at minimum, Doppler  waveform signals and systolic blood pressure reading at the level of  bilateral  brachial, anterior tibial, and posterior tibial arteries, when vessel  segments  are accessible. Bilateral testing is considered an integral part of a  complete  examination. Photoelectric Plethysmograph (PPG) waveforms and toe systolic  pressure readings are included as required and additional duplex testing  as  needed. Limited examinations for reoccurring indications may be performed  as  noted.     ABI Findings:  +---------+------------------+-----+--------+--------+  Right  Rt Pressure (mmHg)IndexWaveformComment   +---------+------------------+-----+--------+--------+  Brachial 177                     +---------+------------------+-----+--------+--------+  ATA   152        0.85 biphasic      +---------+------------------+-----+--------+--------+  PTA   221        1.24 biphasic      +---------+------------------+-----+--------+--------+  Great Toe0         0.00 Absent       +---------+------------------+-----+--------+--------+   +---------+------------------+-----+---------+-------+  Left   Lt Pressure  (mmHg)IndexWaveform Comment  +---------+------------------+-----+---------+-------+  Brachial 178                      +---------+------------------+-----+---------+-------+  ATA   210        1.18 triphasic      +---------+------------------+-----+---------+-------+  PTA   212        1.19 triphasic      +---------+------------------+-----+---------+-------+  Great Toe147        0.83 Normal        +---------+------------------+-----+---------+-------+         Summary:  Right: Resting right ankle-brachial index is within normal range. No  evidence of significant right lower extremity arterial disease. The  right  toe-brachial index is abnormal.   Left: Resting left ankle-brachial index is within normal range. No  evidence of significant left lower extremity arterial disease. The left  toe-brachial index is normal.      *See table(s) above for measurements and observations.   Rande Brunt. Lenell Antu, MD [facility] Phone Number: [phone] 04/05/2020 5:21 PM

## 2020-04-06 ENCOUNTER — Ambulatory Visit (HOSPITAL_COMMUNITY)
Admission: RE | Admit: 2020-04-06 | Discharge: 2020-04-06 | Disposition: A | Discharge status: Home / Self Care | Payer: Medicare Other | Attending: Vascular Surgery | Admitting: Vascular Surgery

## 2020-04-06 ENCOUNTER — Encounter (HOSPITAL_COMMUNITY)
Admission: RE | Disposition: A | Discharge status: Home / Self Care | Payer: Self-pay | Source: Home / Self Care | Attending: Vascular Surgery

## 2020-04-06 ENCOUNTER — Other Ambulatory Visit: Payer: Self-pay

## 2020-04-06 DIAGNOSIS — I70261 Atherosclerosis of native arteries of extremities with gangrene, right leg: Secondary | ICD-10-CM | POA: Insufficient documentation

## 2020-04-06 DIAGNOSIS — Z79899 Other long term (current) drug therapy: Secondary | ICD-10-CM | POA: Insufficient documentation

## 2020-04-06 DIAGNOSIS — I96 Gangrene, not elsewhere classified: Secondary | ICD-10-CM | POA: Diagnosis not present

## 2020-04-06 DIAGNOSIS — Z7901 Long term (current) use of anticoagulants: Secondary | ICD-10-CM | POA: Insufficient documentation

## 2020-04-06 DIAGNOSIS — E1152 Type 2 diabetes mellitus with diabetic peripheral angiopathy with gangrene: Secondary | ICD-10-CM | POA: Diagnosis not present

## 2020-04-06 DIAGNOSIS — Z7982 Long term (current) use of aspirin: Secondary | ICD-10-CM | POA: Diagnosis not present

## 2020-04-06 HISTORY — PX: ABDOMINAL AORTOGRAM W/LOWER EXTREMITY: CATH118223

## 2020-04-06 LAB — POCT I-STAT, CHEM 8
BUN: 6 mg/dL — ABNORMAL LOW (ref 8–23)
Calcium, Ion: 1.21 mmol/L (ref 1.15–1.40)
Chloride: 105 mmol/L (ref 98–111)
Creatinine, Ser: 0.7 mg/dL (ref 0.44–1.00)
Glucose, Bld: 106 mg/dL — ABNORMAL HIGH (ref 70–99)
HCT: 29 % — ABNORMAL LOW (ref 36.0–46.0)
Hemoglobin: 9.9 g/dL — ABNORMAL LOW (ref 12.0–15.0)
Potassium: 3.3 mmol/L — ABNORMAL LOW (ref 3.5–5.1)
Sodium: 143 mmol/L (ref 135–145)
TCO2: 24 mmol/L (ref 22–32)

## 2020-04-06 SURGERY — ABDOMINAL AORTOGRAM W/LOWER EXTREMITY
Anesthesia: LOCAL

## 2020-04-06 MED ORDER — SODIUM CHLORIDE 0.9% FLUSH: 3.0000 mL | Freq: Two times a day (BID) | INTRAVENOUS | Status: DC

## 2020-04-06 MED ORDER — FENTANYL CITRATE (PF) 100 MCG/2ML IJ SOLN
INTRAMUSCULAR | Status: DC | PRN
  Administered 2020-04-06: 50 ug via INTRAVENOUS

## 2020-04-06 MED ORDER — ASPIRIN EC 81 MG PO TBEC: 81.0000 mg | DELAYED_RELEASE_TABLET | Freq: Every day | ORAL | Status: DC

## 2020-04-06 MED ORDER — FENTANYL CITRATE (PF) 100 MCG/2ML IJ SOLN
INTRAMUSCULAR | Status: AC
  Filled 2020-04-06: qty 2

## 2020-04-06 MED ORDER — LABETALOL HCL 5 MG/ML IV SOLN: 10.0000 mg | INTRAVENOUS | Status: DC | PRN

## 2020-04-06 MED ORDER — SODIUM CHLORIDE 0.9% FLUSH: 3.0000 mL | INTRAVENOUS | Status: DC | PRN

## 2020-04-06 MED ORDER — ONDANSETRON HCL 4 MG/2ML IJ SOLN: 4.0000 mg | Freq: Four times a day (QID) | INTRAMUSCULAR | Status: DC | PRN

## 2020-04-06 MED ORDER — HEPARIN (PORCINE) IN NACL 1000-0.9 UT/500ML-% IV SOLN
INTRAVENOUS | Status: AC
  Filled 2020-04-06: qty 1000

## 2020-04-06 MED ORDER — LABETALOL HCL 5 MG/ML IV SOLN
INTRAVENOUS | Status: AC
  Filled 2020-04-06: qty 4

## 2020-04-06 MED ORDER — CLOPIDOGREL BISULFATE 75 MG PO TABS: 75.0000 mg | ORAL_TABLET | Freq: Every day | ORAL | Status: DC

## 2020-04-06 MED ORDER — LIDOCAINE HCL (PF) 1 % IJ SOLN
INTRAMUSCULAR | Status: AC
  Filled 2020-04-06: qty 30

## 2020-04-06 MED ORDER — LIDOCAINE HCL (PF) 1 % IJ SOLN
INTRAMUSCULAR | Status: DC | PRN
  Administered 2020-04-06: 20 mL via INTRADERMAL

## 2020-04-06 MED ORDER — HYDRALAZINE HCL 20 MG/ML IJ SOLN: 5.0000 mg | INTRAMUSCULAR | Status: DC | PRN

## 2020-04-06 MED ORDER — MIDAZOLAM HCL 2 MG/2ML IJ SOLN
INTRAMUSCULAR | Status: AC
  Filled 2020-04-06: qty 2

## 2020-04-06 MED ORDER — SODIUM CHLORIDE 0.9 % IV SOLN: INTRAVENOUS | Status: DC

## 2020-04-06 MED ORDER — MIDAZOLAM HCL 2 MG/2ML IJ SOLN
INTRAMUSCULAR | Status: DC | PRN
  Administered 2020-04-06: 1 mg via INTRAVENOUS

## 2020-04-06 MED ORDER — LABETALOL HCL 5 MG/ML IV SOLN
INTRAVENOUS | Status: DC | PRN
  Administered 2020-04-06: 10 mg via INTRAVENOUS

## 2020-04-06 MED ORDER — ACETAMINOPHEN 325 MG PO TABS: 650.0000 mg | ORAL_TABLET | ORAL | Status: DC | PRN

## 2020-04-06 MED ORDER — SODIUM CHLORIDE 0.9 % IV SOLN: 250.0000 mL | INTRAVENOUS | Status: DC | PRN

## 2020-04-06 MED ORDER — HEPARIN (PORCINE) IN NACL 1000-0.9 UT/500ML-% IV SOLN
INTRAVENOUS | Status: DC | PRN
  Administered 2020-04-06 (×2): 500 mL

## 2020-04-06 MED ORDER — IODIXANOL 320 MG/ML IV SOLN
INTRAVENOUS | Status: DC | PRN
  Administered 2020-04-06: 114 mL via INTRA_ARTERIAL

## 2020-04-06 SURGICAL SUPPLY — 10 items
CATH OMNI FLUSH 5F 65CM (CATHETERS) ×2 IMPLANT
DEVICE CLOSURE MYNXGRIP 5F (Vascular Products) ×2 IMPLANT
GLIDEWIRE ADV .035X260CM (WIRE) ×2 IMPLANT
KIT MICROPUNCTURE NIT STIFF (SHEATH) ×2 IMPLANT
KIT PV (KITS) ×2 IMPLANT
SHEATH PINNACLE 5F 10CM (SHEATH) ×2 IMPLANT
SHEATH PROBE COVER 6X72 (BAG) ×4 IMPLANT
SYR MEDRAD MARK V 150ML (SYRINGE) ×2 IMPLANT
TRANSDUCER W/STOPCOCK (MISCELLANEOUS) ×2 IMPLANT
TRAY PV CATH (CUSTOM PROCEDURE TRAY) ×2 IMPLANT

## 2020-04-06 NOTE — Interval H&P Note (Signed)
History and Physical Interval Note:  04/06/2020 10:57 AM  Patricia Atkinson  has presented today for surgery, with the diagnosis of grangren.  The various methods of treatment have been discussed with the patient and family. After consideration of risks, benefits and other options for treatment, the patient has consented to  Procedure(s): ABDOMINAL AORTOGRAM W/LOWER EXTREMITY (N/A) as a surgical intervention.  The patient's history has been reviewed, patient examined, no change in status, stable for surgery.  I have reviewed the patient's chart and labs.  Questions were answered to the patient's satisfaction.     Leonie Douglas

## 2020-04-06 NOTE — Progress Notes (Signed)
Order noted for patient to start Plavix at 8am tomorrow.  Pt not currently on Plavix, On Elaquis.  Dr. Juanetta Gosling notifed for clarification.  Order for Plavix discontinued per verbal order and pt instructed to resume Elaquis in am.

## 2020-04-06 NOTE — Discharge Instructions (Signed)
Restart Elaquis at 8 am 04/07/20       Femoral Site Care This sheet gives you information about how to care for yourself after your procedure. Your health care provider may also give you more specific instructions. If you have problems or questions, contact your health care provider. What can I expect after the procedure? After the procedure, it is common to have:  Bruising that usually fades within 1-2 weeks.  Tenderness at the site. Follow these instructions at home: Wound care  Follow instructions from your health care provider about how to take care of your insertion site. Make sure you: ? Wash your hands with soap and water before you change your bandage (dressing). If soap and water are not available, use hand sanitizer. ? Change your dressing as told by your health care provider. ? Leave stitches (sutures), skin glue, or adhesive strips in place. These skin closures may need to stay in place for 2 weeks or longer. If adhesive strip edges start to loosen and curl up, you may trim the loose edges. Do not remove adhesive strips completely unless your health care provider tells you to do that.  Do not take baths, swim, or use a hot tub until your health care provider approves.  You may shower 24-48 hours after the procedure or as told by your health care provider. ? Gently wash the site with plain soap and water. ? Pat the area dry with a clean towel. ? Do not rub the site. This may cause bleeding.  Do not apply powder or lotion to the site. Keep the site clean and dry.  Check your femoral site every day for signs of infection. Check for: ? Redness, swelling, or pain. ? Fluid or blood. ? Warmth. ? Pus or a bad smell. Activity  For the first 2-3 days after your procedure, or as long as directed: ? Avoid climbing stairs as much as possible. ? Do not squat.  Do not lift anything that is heavier than 10 lb (4.5 kg), or the limit that you are told, until your health care provider  says that it is safe.  Rest as directed. ? Avoid sitting for a long time without moving. Get up to take short walks every 1-2 hours.  Do not drive for 24 hours if you were given a medicine to help you relax (sedative). General instructions  Take over-the-counter and prescription medicines only as told by your health care provider.  Keep all follow-up visits as told by your health care provider. This is important. Contact a health care provider if you have:  A fever or chills.  You have redness, swelling, or pain around your insertion site. Get help right away if:  The catheter insertion area swells very fast.  You pass out.  You suddenly start to sweat or your skin gets clammy.  The catheter insertion area is bleeding, and the bleeding does not stop when you hold steady pressure on the area.  The area near or just beyond the catheter insertion site becomes pale, cool, tingly, or numb. These symptoms may represent a serious problem that is an emergency. Do not wait to see if the symptoms will go away. Get medical help right away. Call your local emergency services (911 in the [place] Do not drive yourself to the hospital. Summary  After the procedure, it is common to have bruising that usually fades within 1-2 weeks.  Check your femoral site every day for signs of infection.  Do not  lift anything that is heavier than 10 lb (4.5 kg), or the limit that you are told, until your health care provider says that it is safe. This information is not intended to replace advice given to you by your health care provider. Make sure you discuss any questions you have with your health care provider. Document Revised: 04/22/2017 Document Reviewed: 04/22/2017 Elsevier Patient Education  2020 [employer].

## 2020-04-06 NOTE — Op Note (Addendum)
DATE OF SERVICE: 04/06/2020  PATIENT:  Patricia Atkinson  59 y.o. female  PRE-OPERATIVE DIAGNOSIS:  Right toe 3-5 dry gangrene  POST-OPERATIVE DIAGNOSIS:  same  PROCEDURE:  US Guided LCFA access Aortogram Right lower extremity angiogram with second order cannulation (114cc total contrast) Conscious sedation (29 minutes)  SURGEON:  Surgeon(s) and Role:    * Leonie Douglas, MD - Primary  ASSISTANT: none  ANESTHESIA:   local and IV sedation  EBL: min  BLOOD ADMINISTERED:none  DRAINS: none   LOCAL MEDICATIONS USED:  LIDOCAINE   SPECIMEN:  none  COUNTS: confirmed correct.  TOURNIQUET:  * No tourniquets in log *  PATIENT DISPOSITION:  PACU - hemodynamically stable.   Delay start of Pharmacological VTE agent (>24hrs) due to surgical blood loss or risk of bleeding: no  INDICATION FOR PROCEDURE: [name] is a 59 y.o. female with dry gangrene of right toes 3-5 after successful thrombectomy. After careful discussion of risks, benefits, and alternatives the patient was offered angiogram with possible intervention. We specifically discussed access site complications. The patient understood and wished to proceed.  OPERATIVE FINDINGS: RLE peroneal only runoff to the foot. Otherwise normal angiogram.  DESCRIPTION OF PROCEDURE: After identification of the patient in the pre-operative holding area, the patient was transferred to the operating room. The patient was positioned supine on the operating room table. Anesthesia was induced. The groins was prepped and draped in standard fashion. A surgical pause was performed confirming correct patient, procedure, and operative location.  To begin the left groin was anesthetized with 1% lidocaine. Using ultrasound guidance, the left common femoral artery was accessed with micropuncture technique. Fluoroscopy was used to confirm cannulation over the femoral head. Sheathogram was not performed. The 9F sheath was upsized to 71F.   An 035  Teena Dunk wire was advanced into the distal aorta. Over the wire an omni flush catheter was advanced to the level of L2. Aortogram was performed, revealing normal aorto-iliac vessels.   The right common iliac artery was selected with an 035 floppy angled glidewire. The wire was advanced into the superficial femoral artery. Over the wire the omni flush catheter was advanced into the external iliac artery. Selective angiography was performed revealing:  Unremarkable angiogram. Peroneal only runoff to the right foot.  We tried to use a mynx device to close the arteriotomy, but it maldeployed. Manual pressure was held. Hemostasis was excellent upon completion.  Upon completion of the case instrument and sharps counts were confirmed correct. The patient was transferred to the PACU in good condition. I was present for all portions of the procedure.  Conscious sedation was administered with the use of IV fentanyl and midazolam under continuous physician and nurse monitoring.  Heart rate, blood pressure, and oxygen saturation were continuously monitored.  Total sedation time was 29 minutes  Rande Brunt. Lenell Antu, MD [facility] Phone Number: [phone] 04/06/2020 10:57 AM

## 2020-04-07 ENCOUNTER — Telehealth: Payer: Self-pay

## 2020-04-07 ENCOUNTER — Encounter (HOSPITAL_COMMUNITY): Payer: Self-pay | Admitting: Vascular Surgery

## 2020-04-07 NOTE — Telephone Encounter (Signed)
PT called to ask about restrictions after aortogram. No restrictions per PA.

## 2020-04-08 ENCOUNTER — Telehealth: Payer: Self-pay

## 2020-04-08 NOTE — Telephone Encounter (Signed)
Patient is having some soreness in her groin s/p angio - denies any pain in her foot. She may try heat or ice on the site and take tylenol for discomfort. Patient verbalized understanding.

## 2020-04-12 ENCOUNTER — Telehealth: Payer: Self-pay

## 2020-04-12 NOTE — Telephone Encounter (Signed)
Patient left VM asking to talk to a nurse. Returned call, no answer - left VM.

## 2020-04-18 ENCOUNTER — Telehealth: Payer: Self-pay

## 2020-04-18 NOTE — Telephone Encounter (Signed)
Spoke with Marlan Palau from PT/OT. Called to report slightly elevated patient BP of 164/94. Patient has hx of HTN and is taking BP medication. She denied dizziness, headache, blurred vision. Also says patient's RLE is swollen - but has not gotten worse. Per Carney Bern - patient's mobility is very good.

## 2020-04-25 DIAGNOSIS — I10 Essential (primary) hypertension: Secondary | ICD-10-CM | POA: Diagnosis not present

## 2020-04-25 DIAGNOSIS — I4891 Unspecified atrial fibrillation: Secondary | ICD-10-CM | POA: Diagnosis not present

## 2020-04-25 DIAGNOSIS — I743 Embolism and thrombosis of arteries of the lower extremities: Secondary | ICD-10-CM | POA: Diagnosis not present

## 2020-04-25 DIAGNOSIS — I5040 Unspecified combined systolic (congestive) and diastolic (congestive) heart failure: Secondary | ICD-10-CM | POA: Diagnosis not present

## 2020-04-25 DIAGNOSIS — U071 COVID-19: Secondary | ICD-10-CM | POA: Diagnosis not present

## 2020-04-26 ENCOUNTER — Other Ambulatory Visit: Payer: Self-pay

## 2020-04-26 ENCOUNTER — Ambulatory Visit (INDEPENDENT_AMBULATORY_CARE_PROVIDER_SITE_OTHER): Payer: Self-pay | Admitting: Vascular Surgery

## 2020-04-26 ENCOUNTER — Encounter (HOSPITAL_COMMUNITY): Payer: Medicare Other

## 2020-04-26 ENCOUNTER — Encounter: Payer: Medicare Other | Admitting: Vascular Surgery

## 2020-04-26 DIAGNOSIS — I96 Gangrene, not elsewhere classified: Secondary | ICD-10-CM

## 2020-04-26 DIAGNOSIS — R6889 Other general symptoms and signs: Secondary | ICD-10-CM | POA: Diagnosis not present

## 2020-04-26 MED ORDER — APIXABAN 5 MG PO TABS
5.0000 mg | ORAL_TABLET | Freq: Two times a day (BID) | ORAL | 2 refills | Status: AC
Start: 2020-04-26 — End: ?

## 2020-04-26 NOTE — Progress Notes (Signed)
ASSESSMENT & PLAN:  60 y.o. female with dry gangrene of right toes 3-5 after RLE arterial thrombectomy 03/17/20. Angiogram 04/06/20 confirms peroneal only runoff to the foot, but reasonable flow to the foot. I have offered her amputation of toes 3-5. She would prefer to wait. I instructed her to paint the toes with betadine daily and to avoid injury as best she can. She was given return instructions should the toes worsen or become infected. Will see her back in a month. Will repeat ABI with toe pressures to assess potential for healing toe amputations.  CHIEF COMPLAINT:   Right third gangrene  HISTORY:  HISTORY OF PRESENT ILLNESS: Patricia Atkinson is a 60 y.o. female who underwent arterial thrombectomy and fasciotomies of her right lower extremity 03/17/20 for acute limb ischemia. Of note she was COVID (+) at the time. She did very well postoperatively. She was discharged after a typical postoperative course. At home she developed darkening of her third, fourth and fifth toes. She presents today for evaluation. She has been ambulatory without pain.   04/27/19: Returns for evaluation after angiogram 04/06/20. Toes unchanged.   Past Medical History:  Diagnosis Date  . Heart failure (HCC)    diagnosed around 2017 in [place]  . Hyperlipidemia   . Peripheral arterial disease [facility])     Past Surgical History:  Procedure Laterality Date  . ABDOMINAL AORTOGRAM W/LOWER EXTREMITY N/A 04/06/2020   Procedure: ABDOMINAL AORTOGRAM W/LOWER EXTREMITY;  Surgeon: Leonie Douglas, MD;  Location: [facility];  Service: Cardiovascular;  Laterality: N/A;  . THROMBECTOMY OF BYPASS GRAFT FEMORAL- TIBIAL ARTERY Right 03/17/2020   Procedure: ILIO-FEMORAL AND TIBIAL THROMBECTOMY WITH FOUR COMPARTMENT FASCIOTOMY;  Surgeon: Leonie Douglas, MD;  Location: [facility] OR;  Service: Vascular;  Laterality: Right;    Family History  Problem Relation Age of Onset  . Heart attack Father   . Heart attack Paternal  Grandmother     Social History   Socioeconomic History  . Marital status: Widowed    Spouse name: Not on file  . Number of children: Not on file  . Years of education: Not on file  . Highest education level: Not on file  Occupational History  . Not on file  Tobacco Use  . Smoking status: Never Smoker  . Smokeless tobacco: Never Used  Vaping Use  . Vaping Use: Never used  Substance and Sexual Activity  . Alcohol use: Yes    Comment: socially  . Drug use: Never  . Sexual activity: Not on file  Other Topics Concern  . Not on file  Social History Narrative  . Not on file   Social Determinants of Health   Financial Resource Strain: Not on file  Food Insecurity: Not on file  Transportation Needs: Not on file  Physical Activity: Not on file  Stress: Not on file  Social Connections: Not on file  Intimate Partner Violence: Not on file    No Known Allergies  Current Outpatient Medications  Medication Sig Dispense Refill  . albuterol (VENTOLIN HFA) 108 (90 Base) MCG/ACT inhaler Inhale 2 puffs into the lungs every 4 (four) hours as needed for wheezing or shortness of breath. 6.7 g 1  . apixaban (ELIQUIS) 5 MG TABS tablet Take 1 tablet (5 mg total) by mouth 2 (two) times daily. 60 tablet 2  . aspirin EC 81 MG EC tablet Take 1 tablet (81 mg total) by mouth daily at 6 (six) AM. Swallow whole. 30 tablet 11  .  carvedilol (COREG) 12.5 MG tablet Take 12.5 mg by mouth 2 (two) times daily with a meal.    . furosemide (LASIX) 40 MG tablet Take 40-80 mg by mouth See admin instructions. 80 MG in the morning and 40 MG in the evening    . losartan (COZAAR) 25 MG tablet Take 1 tablet (25 mg total) by mouth daily. 30 tablet 1  . oxyCODONE (OXY IR/ROXICODONE) 5 MG immediate release tablet Take 1 tablet (5 mg total) by mouth every 6 (six) hours as needed for moderate pain. 20 tablet 0  . simvastatin (ZOCOR) 40 MG tablet Take 40 mg by mouth daily.    Marland Kitchen spironolactone (ALDACTONE) 25 MG tablet Take  25 mg by mouth daily.     No current facility-administered medications for this visit.    REVIEW OF SYSTEMS:  [X]  denotes positive finding, [ ]  denotes negative finding Cardiac  Comments:  Chest pain or chest pressure:    Shortness of breath upon exertion:    Short of breath when lying flat:    Irregular heart rhythm:        Vascular    Pain in calf, thigh, or hip brought on by ambulation:    Pain in feet at night that wakes you up from your sleep:     Blood clot in your veins:    Leg swelling:         Pulmonary    Oxygen at home:    Productive cough:     Wheezing:         Neurologic    Sudden weakness in arms or legs:     Sudden numbness in arms or legs:     Sudden onset of difficulty speaking or slurred speech:    Temporary loss of vision in one eye:     Problems with dizziness:         Gastrointestinal    Blood in stool:     Vomited blood:         Genitourinary    Burning when urinating:     Blood in urine:        Psychiatric    Major depression:         Hematologic    Bleeding problems:    Problems with blood clotting too easily:        Skin    Rashes or ulcers:        Constitutional    Fever or chills:     PHYSICAL EXAM:   VS: BP 182/103; HR 103; T 97.3; RR 20; O2 96%  Constitutional: Well appearing in no distress. Appears well nourished.  Neurologic: Normal gait and station. CN intact. No weakness. No sensory loss. Psychiatric: Mood and affect symmetric and appropriate. Eyes: No icterus. No conjunctival pallor. Ears, nose, throat: mucous membranes moist. Midline trachea. No carotid bruit. Cardiac: regular rate and rhythm.  Respiratory: unlabored. Abdominal: soft, non-tender, non-distended. No palpable pulsatile abdominal mass. Peripheral vascular:  No palpable pedal pulses  Incisions healing well RLE  Extremity: No edema. No cyanosis. No pallor.  Skin: unchanged dry gangrene of right toe tips 3-5. No ulceration.  Lymphatic: No Stemmer's sign.  No palpable lymphadenopathy.   DATA REVIEW:    Most recent CBC CBC Latest Ref Rng & Units 04/06/2020 03/20/2020 03/19/2020  WBC 4.0 - 10.5 K/uL - 9.9 10.1  Hemoglobin 12.0 - 15.0 g/dL 03/22/2020) 03/21/2020) 10.8(L)  Hematocrit 36.0 - 46.0 % 29.0(L) 29.8(L) 34.1(L)  Platelets 150 - 400 K/uL -  310 324     Most recent CMP CMP Latest Ref Rng & Units 04/06/2020 03/22/2020 03/21/2020  Glucose 70 - 99 mg/dL 355(D) 322(G) 254(Y)  BUN 8 - 23 mg/dL 6(L) 70(W) 23(J)  Creatinine 0.44 - 1.00 mg/dL 6.28 3.15(V) 7.61(Y)  Sodium 135 - 145 mmol/L 143 138 139  Potassium 3.5 - 5.1 mmol/L 3.3(L) 4.3 4.0  Chloride 98 - 111 mmol/L 105 106 108  CO2 22 - 32 mmol/L - 20(L) 23  Calcium 8.9 - 10.3 mg/dL - 0.7(P) 7.1(G)  Total Protein 6.5 - 8.1 g/dL - 5.8(L) 5.5(L)  Total Bilirubin 0.3 - 1.2 mg/dL - 1.0 0.9  Alkaline Phos 38 - 126 U/L - 195(H) 147(H)  AST 15 - 41 U/L - 58(H) 59(H)  ALT 0 - 44 U/L - 42 36    Renal function CrCl cannot be calculated (Unknown ideal weight.).  Hgb A1c MFr Bld (%)  Date Value  03/18/2020 6.0 (H)    LDL Cholesterol  Date Value Ref Range Status  03/18/2020 121 (H) 0 - 99 mg/dL Final    Comment:           Total Cholesterol/HDL:CHD Risk Coronary Heart Disease Risk Table                     Men   Women  1/2 Average Risk   3.4   3.3  Average Risk       5.0   4.4  2 X Average Risk   9.6   7.1  3 X Average Risk  23.4   11.0        Use the calculated Patient Ratio above and the CHD Risk Table to determine the patient's CHD Risk.        ATP III CLASSIFICATION (LDL):  <100     mg/dL   Optimal  626-948  mg/dL   Near or Above                    Optimal  130-159  mg/dL   Borderline  546-270  mg/dL   High  >350     mg/dL   Very High Performed at [facility], [address] [place], [place] [address]      LOWER EXTREMITY ARTERIAL DUPLEX    Indications: Indications:03/28/20 New symptom of blue discoloration of  digits 3,        4, and 5.   High Risk  Factors: Diabetes.   Other Factors: Other Factors: 03/17/20 Diagnosis of Afib leading to  embolus of         RLE causing acute ischemia.           Vascular Interventions:03/17/20 Iliofemoral and tibial         thrombectomy and 4 compartment fasciotomy.  Current ABI: R 1.24   Comparison Study: none   Performing Technologist: Jeb Levering RDMS, RVT     Examination Guidelines: A complete evaluation includes B-mode imaging,  spectral  Doppler, color Doppler, and power Doppler as needed of all accessible  portions  of each vessel. Bilateral testing is considered an integral part of a  complete  examination. Limited examinations for reoccurring indications may be  performed  as noted.     +-----------+--------+-----+--------+---------+--------+  RIGHT   PSV cm/sRatioStenosisWaveform Comments  +-----------+--------+-----+--------+---------+--------+  CFA Distal 193          triphasic      +-----------+--------+-----+--------+---------+--------+  DFA    174  triphasic      +-----------+--------+-----+--------+---------+--------+  SFA Prox  177          triphasic      +-----------+--------+-----+--------+---------+--------+  SFA Mid  133          triphasic      +-----------+--------+-----+--------+---------+--------+  SFA Distal 110          triphasic      +-----------+--------+-----+--------+---------+--------+  POP Prox  98          biphasic       +-----------+--------+-----+--------+---------+--------+  POP Distal 95          biphasic       +-----------+--------+-----+--------+---------+--------+  ATA Distal 71          biphasic       +-----------+--------+-----+--------+---------+--------+  PTA Distal 92          biphasic        +-----------+--------+-----+--------+---------+--------+  PERO Distal86          biphasic       +-----------+--------+-----+--------+---------+--------+            Summary:  Right: Normal examination. No evidence of arterial occlusive disease.     See table(s) above for measurements and observations.      Electronically signed by Fabienne Bruns MD on 03/31/2020 at 4:17:39 PM.   LOWER EXTREMITY DOPPLER STUDY   Indications: 03/28/20 New symptom of blue discoloration of digits 3, 4, and  5.   High Risk Factors: Diabetes.   Other Factors: 03/17/20 Diagnosis of Afib leading to embolus of RLE  causing         acute ischemia.  Vascular Interventions: 03/17/20 Iliofemoral and tibial thrombectomy and  4             compartment fasciotomy.   Performing Technologist: Jeb Levering RDMS, RVT     Examination Guidelines: A complete evaluation includes at minimum, Doppler  waveform signals and systolic blood pressure reading at the level of  bilateral  brachial, anterior tibial, and posterior tibial arteries, when vessel  segments  are accessible. Bilateral testing is considered an integral part of a  complete  examination. Photoelectric Plethysmograph (PPG) waveforms and toe systolic  pressure readings are included as required and additional duplex testing  as  needed. Limited examinations for reoccurring indications may be performed  as  noted.     ABI Findings:  +---------+------------------+-----+--------+--------+  Right  Rt Pressure (mmHg)IndexWaveformComment   +---------+------------------+-----+--------+--------+  Brachial 177                     +---------+------------------+-----+--------+--------+  ATA   152        0.85 biphasic      +---------+------------------+-----+--------+--------+  PTA   221        1.24 biphasic       +---------+------------------+-----+--------+--------+  Great Toe0         0.00 Absent       +---------+------------------+-----+--------+--------+   +---------+------------------+-----+---------+-------+  Left   Lt Pressure (mmHg)IndexWaveform Comment  +---------+------------------+-----+---------+-------+  Brachial 178                      +---------+------------------+-----+---------+-------+  ATA   210        1.18 triphasic      +---------+------------------+-----+---------+-------+  PTA   212        1.19 triphasic      +---------+------------------+-----+---------+-------+  Great Toe147        0.83 Normal        +---------+------------------+-----+---------+-------+  Summary:  Right: Resting right ankle-brachial index is within normal range. No  evidence of significant right lower extremity arterial disease. The right  toe-brachial index is abnormal.   Left: Resting left ankle-brachial index is within normal range. No  evidence of significant left lower extremity arterial disease. The left  toe-brachial index is normal.      *See table(s) above for measurements and observations.   Yevonne Aline. Stanford Breed, MD [facility] Phone Number: [phone] 04/26/2020 10:52 AM

## 2020-04-27 ENCOUNTER — Other Ambulatory Visit: Payer: Self-pay

## 2020-04-27 DIAGNOSIS — I96 Gangrene, not elsewhere classified: Secondary | ICD-10-CM

## 2020-04-28 ENCOUNTER — Telehealth: Payer: Self-pay | Admitting: Cardiology

## 2020-04-28 ENCOUNTER — Telehealth: Payer: Self-pay | Admitting: General Practice

## 2020-04-28 ENCOUNTER — Encounter (HOSPITAL_COMMUNITY): Payer: Medicare Other

## 2020-04-28 MED ORDER — CARVEDILOL 12.5 MG PO TABS: 12.5000 mg | ORAL_TABLET | Freq: Two times a day (BID) | ORAL | 0 refills | Status: DC

## 2020-04-28 MED ORDER — FUROSEMIDE 40 MG PO TABS: 40.0000 mg | ORAL_TABLET | ORAL | 0 refills | Status: DC

## 2020-04-28 MED ORDER — SPIRONOLACTONE 25 MG PO TABS: 25.0000 mg | ORAL_TABLET | Freq: Every day | ORAL | 0 refills | Status: DC

## 2020-04-28 MED ORDER — SIMVASTATIN 40 MG PO TABS: 40.0000 mg | ORAL_TABLET | Freq: Every day | ORAL | 0 refills | Status: DC

## 2020-04-28 NOTE — Telephone Encounter (Signed)
Pt c/o medication issue:  1. Name of Medication: Lisinopril 10mg   2. How are you currently taking this medication (dosage and times per day)? As written  3. Are you having a reaction (difficulty breathing--STAT)? No   4. What is your medication issue? Patient needs prescription filled for medication

## 2020-04-28 NOTE — Telephone Encounter (Signed)
Medications listed below. Refilled as requested.

## 2020-04-28 NOTE — Telephone Encounter (Signed)
Pt c/o medication issue:  1. Name of Medication: carvedilol (COREG) 12.5 MG tablet; furosemide (LASIX) 40 MG tablet; simvastatin (ZOCOR) 40 MG tablet; spironolactone (ALDACTONE) 25 MG tablet  2. How are you currently taking this medication (dosage and times per day)? As written  3. Are you having a reaction (difficulty breathing--STAT)? No   4. What is your medication issue? Patient needs prescription for all medications listed above.

## 2020-04-28 NOTE — Telephone Encounter (Signed)
Returned call to patient, lisinopril not on current med list.     Discharge summary on 11/30 from hospital with instructions to stop lisinopril.      She has follow up on 1/19-advised to bring all medications with her to appt.

## 2020-04-29 ENCOUNTER — Ambulatory Visit: Payer: Medicare Other | Admitting: Cardiology

## 2020-05-03 ENCOUNTER — Encounter: Payer: Medicare Other | Admitting: Vascular Surgery

## 2020-05-09 NOTE — Progress Notes (Signed)
Cardiology Clinic Note   Patient Name: Patricia Atkinson Date of Encounter: 05/11/2020  Primary Care Provider:  Patient, No Pcp Per Primary Cardiologist:  Rollene Rotunda, MD  Patient Profile    Patricia Atkinson 60 year old female presents the clinic today for follow-up evaluation of her atrial fibrillation, essential hypertension and acute CHF.  Past Medical History    Past Medical History:  Diagnosis Date  . Heart failure (HCC)    diagnosed around 2017 in [place]  . Hyperlipidemia   . Peripheral arterial disease [facility])    Past Surgical History:  Procedure Laterality Date  . ABDOMINAL AORTOGRAM W/LOWER EXTREMITY N/A 04/06/2020   Procedure: ABDOMINAL AORTOGRAM W/LOWER EXTREMITY;  Surgeon: Leonie Douglas, MD;  Location: [facility];  Service: Cardiovascular;  Laterality: N/A;  . THROMBECTOMY OF BYPASS GRAFT FEMORAL- TIBIAL ARTERY Right 03/17/2020   Procedure: ILIO-FEMORAL AND TIBIAL THROMBECTOMY WITH FOUR COMPARTMENT FASCIOTOMY;  Surgeon: Leonie Douglas, MD;  Location: [facility] OR;  Service: Vascular;  Laterality: Right;    Allergies  No Known Allergies  History of Present Illness    Patricia Atkinson has a PMH of atrial fibrillation, acute CHF, essential hypertension, and COVID-19 infection.  When she was seen by Dr. Antoine Poche 03/18/2020 she reported that she was diagnosed with heart failure in 2017 while she lived in [place].  She was admitted to the hospital near [facility].  At that time she reported she will underwent cardiac catheterization which did not show significant coronary artery disease.  She did not know her ejection fraction and did not remember an echocardiogram.  She reported that she had not seen a cardiologist for several years and did not member the name of her prior cardiologist or of the hospital.  Since 2017 she denied issues with heart failure symptoms.  She was not aware of any history of atrial fibrillation or atrial flutter.  She did report she knew of a leaky  heart valve however was never told how severe it was.  She reported that 2 weeks prior to her hospital admission she was attending a funeral and noted shortness of breath since that time.  She presented to [facility] long hospital 03/17/2020 with 12-hour onset of lower extremity pain.  The ED physician was unable to feel pulses on her right side.  A CT angiogram demonstrated an occlusive filling defect and likely thrombus in the right common iliac artery extending into her external iliac and internal.  Occlusive defect was also noted in the distal popliteal artery extending into the tibial trunk and bifurcation vessels with no flow demonstrated in her right calf runoff vessels.  Her EKG at that time demonstrated new onset atrial fibrillation/flutter.  She was started on IV amiodarone in the emergency department and converted to sinus rhythm.  Her symptoms were concerning for embolic ischemia limb secondary to new onset atrial fibrillation.  Vascular surgery was consulted and she underwent iliofemoral thrombectomy, femoral-popliteal thrombectomy, anterior tibial and peroneal thrombectomy, 4 compartment fasciotomy in her right lower extremity.  Her echocardiogram 05/19/2019 showed an EF of 45-50%, G3 DD, mitral valve malcoaptation with severe MR, moderate tricuspid regurgitation.  She also had a positive COVID-19 test at that time.  She presents the clinic today for follow-up evaluation states she feels well.  She has had no further episodes/events of increased heart rate or irregular beats.  She continues to increase her physical activity and is helping care for her elderly mother.  She states that her mother lives in a second story  and she has been walking up and down the stairs daily.  She reports that she plans to move back to [place] in the near future however, she has been going to all of her follow-up visits prior to moving back.  I will order a BMP, give her the salty 6, have her increase her physical activity to  30 minutes/day, and have her follow-up in 6 months.  Today she denies chest pain, shortness of breath, lower extremity edema, fatigue, palpitations, melena, hematuria, hemoptysis, diaphoresis, weakness, presyncope, syncope, orthopnea, and PND.   Home Medications    Prior to Admission medications   Medication Sig Start Date End Date Taking? Authorizing Provider  albuterol (VENTOLIN HFA) 108 (90 Base) MCG/ACT inhaler Inhale 2 puffs into the lungs every 4 (four) hours as needed for wheezing or shortness of breath. 03/22/20   Emilie Rutter, PA-C  apixaban (ELIQUIS) 5 MG TABS tablet Take 1 tablet (5 mg total) by mouth 2 (two) times daily. 04/26/20   Leonie Douglas, MD  aspirin EC 81 MG EC tablet Take 1 tablet (81 mg total) by mouth daily at 6 (six) AM. Swallow whole. 03/23/20   Emilie Rutter, PA-C  carvedilol (COREG) 12.5 MG tablet Take 1 tablet (12.5 mg total) by mouth 2 (two) times daily with a meal. 04/28/20   Rollene Rotunda, MD  furosemide (LASIX) 40 MG tablet Take 1-2 tablets (40-80 mg total) by mouth See admin instructions. 80 MG in the morning and 40 MG in the evening 04/28/20   Rollene Rotunda, MD  losartan (COZAAR) 25 MG tablet Take 1 tablet (25 mg total) by mouth daily. 03/23/20   Emilie Rutter, PA-C  oxyCODONE (OXY IR/ROXICODONE) 5 MG immediate release tablet Take 1 tablet (5 mg total) by mouth every 6 (six) hours as needed for moderate pain. 03/22/20   Emilie Rutter, PA-C  simvastatin (ZOCOR) 40 MG tablet Take 1 tablet (40 mg total) by mouth daily. 04/28/20   Rollene Rotunda, MD  spironolactone (ALDACTONE) 25 MG tablet Take 1 tablet (25 mg total) by mouth daily. 04/28/20   Rollene Rotunda, MD    Family History    Family History  Problem Relation Age of Onset  . Heart attack Father   . Heart attack Paternal Grandmother    She indicated that the status of her father is unknown. She indicated that the status of her paternal grandmother is unknown.  Social History    Social  History   Socioeconomic History  . Marital status: Widowed    Spouse name: Not on file  . Number of children: Not on file  . Years of education: Not on file  . Highest education level: Not on file  Occupational History  . Not on file  Tobacco Use  . Smoking status: Never Smoker  . Smokeless tobacco: Never Used  Vaping Use  . Vaping Use: Never used  Substance and Sexual Activity  . Alcohol use: Yes    Comment: socially  . Drug use: Never  . Sexual activity: Not on file  Other Topics Concern  . Not on file  Social History Narrative  . Not on file   Social Determinants of Health   Financial Resource Strain: Not on file  Food Insecurity: Not on file  Transportation Needs: Not on file  Physical Activity: Not on file  Stress: Not on file  Social Connections: Not on file  Intimate Partner Violence: Not on file     Review of Systems    General:  No  chills, fever, night sweats or weight changes.  Cardiovascular:  No chest pain, dyspnea on exertion, edema, orthopnea, palpitations, paroxysmal nocturnal dyspnea. Dermatological: No rash, lesions/masses Respiratory: No cough, dyspnea Urologic: No hematuria, dysuria Abdominal:   No nausea, vomiting, diarrhea, bright red blood per rectum, melena, or hematemesis Neurologic:  No visual changes, wkns, changes in mental status. All other systems reviewed and are otherwise negative except as noted above.  Physical Exam    VS:  BP 130/86 (BP Location: Left Arm, Patient Position: Sitting, Cuff Size: Normal)   Pulse 72   Wt 176 lb (79.8 kg)   BMI 31.68 kg/m  , BMI Body mass index is 31.68 kg/m. GEN: Well nourished, well developed, in no acute distress. HEENT: normal. Neck: Supple, no JVD, carotid bruits, or masses. Cardiac: RRR, no murmurs, rubs, or gallops. No clubbing, cyanosis, edema.  Radials/DP/PT 2+ and equal bilaterally.  Respiratory:  Respirations regular and unlabored, clear to auscultation bilaterally. GI: Soft,  nontender, nondistended, BS + x 4. MS: no deformity or atrophy. Skin: warm and dry, no rash. Neuro:  Strength and sensation are intact. Psych: Normal affect.  Accessory Clinical Findings    Recent Labs: 03/19/2020: TSH 1.942 03/20/2020: Platelets 310 03/22/2020: ALT 42; B Natriuretic Peptide 720.5; Magnesium 1.7 04/06/2020: BUN 6; Creatinine, Ser 0.70; Hemoglobin 9.9; Potassium 3.3; Sodium 143   Recent Lipid Panel    Component Value Date/Time   CHOL 168 03/18/2020 0122   TRIG 149 03/18/2020 0122   HDL 17 (L) 03/18/2020 0122   CHOLHDL 9.9 03/18/2020 0122   VLDL 30 03/18/2020 0122   LDLCALC 121 (H) 03/18/2020 0122    ECG personally reviewed by me today-none today.  Echocardiogram 03/18/2020 IMPRESSIONS    1. Left ventricular ejection fraction, by estimation, is 45 to 50%. The  left ventricle has mildly decreased function. The left ventricle  demonstrates global hypokinesis. The left ventricular internal cavity size  was mildly dilated. There is mild  concentric left ventricular hypertrophy. Left ventricular diastolic  parameters are consistent with Grade III diastolic dysfunction  (restrictive). Elevated left atrial pressure.  2. Right ventricular systolic function is mildly reduced. The right  ventricular size is normal. There is mildly elevated pulmonary artery  systolic pressure. The estimated right ventricular systolic pressure is  38.3 mmHg.  3. Left atrial size was severely dilated.  4. There is leaflet malcoaptation. Effective regurgitant orifica area is  0.4 cm, regurgitant volume 54 mL, regurgitant fraction 60%. The mitral  valve appears structurally to be grossly normal. Severe mitral valve  regurgitation. No evidence of mitral  stenosis.  5. Tricuspid valve regurgitation is moderate.  6. The aortic valve is tricuspid. Aortic valve regurgitation is not  visualized. No aortic stenosis is present.  7. The inferior vena cava is normal in size with  greater than 50%  respiratory variability, suggesting right atrial pressure of 3 mmHg.   Conclusion(s)/Recommendation(s): Consider TEE to better evaluate the  mechanism and severity of mitral insufficiency.  Assessment & Plan   1.  Atrial fibrillation/flutter-heart rate today 72 bpm.  No recent episodes of palpitations or accelerated heart rate. Continue apixaban, carvedilol Heart healthy low-sodium diet-salty 6 given Increase physical activity as tolerated  Essential hypertension-BP today 130/86.  Well-controlled at home.she stopped her losartan after her prescription ran out.   Continue carvedilol, spironolactone, furosemide Heart healthy low-sodium diet-salty 6 given Increase physical activity as tolerated BMP  Acute combined systolic and diastolic CHF- no increased DOE or activity intolerance.  Echocardiogram showed EF 45-50%  with G3 DD. Continue carvedilol, losartan, spironolactone, furosemide Heart healthy low-sodium diet-salty 6 given Increase physical activity as tolerated Order BMP  Severe mitral regurgitation- no increased DOE or activity intolerance.  Previously evaluated in 2017 in [place].  Contracted COVID-19 and was admitted to the hospital with embolic event of her right leg.  Follow-up echocardiogram showed severe mitral regurgitation. Continue carvedilol, losartan, spironolactone, furosemide Heart healthy low-sodium diet-salty 6 given Increase physical activity as tolerated Discussed having TEE when she moves back to [place].   Hyperlipidemia-03/18/2020: Cholesterol 168; HDL 17; LDL Cholesterol 121; Triglycerides 149; VLDL 30 Continue simvastatin, aspirin Heart healthy low-sodium high-fiber diet Increase physical activity as tolerated  Embolic event of right leg- continues to increase physical activity and has healed well.  Follows with vein vascular surgery.  Underwent thrombectomy and 4 compartment fasciotomy.  Thrombus was secondary to atrial  fibrillation/flutter. Continue apixaban, aspirin Heart healthy low-sodium diet-salty 6 given Increase physical activity as tolerated   Disposition: Follow-up with Dr. Antoine Poche in 6 months.  Thomasene Ripple. Nai Dasch NP-C    05/11/2020, 2:03 PM [facility] [address] Office [phone] Fax [phone]  Notice: This dictation was prepared with Dragon dictation along with smaller phrase technology. Any transcriptional errors that result from this process are unintentional and may not be corrected upon review.  I spent 15 minutes examining this patient, reviewing medications, and using patient centered shared decision making involving her cardiac care.  Prior to her visit I spent greater than 20 minutes reviewing her past medical history,  medications, and prior cardiac tests.

## 2020-05-11 ENCOUNTER — Ambulatory Visit (INDEPENDENT_AMBULATORY_CARE_PROVIDER_SITE_OTHER): Payer: Medicare HMO | Admitting: General Practice

## 2020-05-11 ENCOUNTER — Other Ambulatory Visit: Payer: Self-pay

## 2020-05-11 ENCOUNTER — Encounter: Payer: Self-pay | Admitting: General Practice

## 2020-05-11 VITALS — BP 130/86 | HR 72 | Wt 176.0 lb

## 2020-05-11 DIAGNOSIS — I10 Essential (primary) hypertension: Secondary | ICD-10-CM | POA: Diagnosis not present

## 2020-05-11 DIAGNOSIS — E782 Mixed hyperlipidemia: Secondary | ICD-10-CM | POA: Diagnosis not present

## 2020-05-11 DIAGNOSIS — R6889 Other general symptoms and signs: Secondary | ICD-10-CM | POA: Diagnosis not present

## 2020-05-11 DIAGNOSIS — I480 Paroxysmal atrial fibrillation: Secondary | ICD-10-CM

## 2020-05-11 DIAGNOSIS — I5041 Acute combined systolic (congestive) and diastolic (congestive) heart failure: Secondary | ICD-10-CM

## 2020-05-11 DIAGNOSIS — Z79899 Other long term (current) drug therapy: Secondary | ICD-10-CM

## 2020-05-11 DIAGNOSIS — I340 Nonrheumatic mitral (valve) insufficiency: Secondary | ICD-10-CM | POA: Diagnosis not present

## 2020-05-11 NOTE — Patient Instructions (Signed)
Medication Instructions:  The current medical regimen is effective;  continue present plan and medications as directed. Please refer to the Current Medication list given to you today.  *If you need a refill on your cardiac medications before your next appointment, please call your pharmacy*  Lab Work:   Testing/Procedures:  BMET TODAY   NONE  Special Instructions PLEASE READ AND FOLLOW SALTY 6-ATTACHED-1,800mg  daily  PLEASE WALK 30 MINUTES DAILY  Follow-Up: Your next appointment:  6 month(s) In Person with Rollene Rotunda, MD OR IF UNAVAILABLE JESSE CLEAVER, FNP-C   Please call our office 2 months in advance to schedule this appointment   At [facility], you and your health needs are our priority.  As part of our continuing mission to provide you with exceptional heart care, we have created designated Provider Care Teams.  These Care Teams include your primary Cardiologist (physician) and Advanced Practice Providers (APPs -  Physician Assistants and Nurse Practitioners) who all work together to provide you with the care you need, when you need it.  We recommend signing up for the patient portal called "MyChart".  Sign up information is provided on this After Visit Summary.  MyChart is used to connect with patients for Virtual Visits (Telemedicine).  Patients are able to view lab/test results, encounter notes, upcoming appointments, etc.  Non-urgent messages can be sent to your provider as well.   To learn more about what you can do with MyChart, go to [email or web address].              6 SALTY THINGS TO AVOID     1,800MG  DAILY

## 2020-05-12 LAB — BASIC METABOLIC PANEL
BUN/Creatinine Ratio: 17 (ref 12–28)
BUN: 15 mg/dL (ref 8–27)
CO2: 25 mmol/L (ref 20–29)
Calcium: 10.1 mg/dL (ref 8.7–10.3)
Chloride: 102 mmol/L (ref 96–106)
Creatinine, Ser: 0.87 mg/dL (ref 0.57–1.00)
GFR calc Af Amer: 83 mL/min/{1.73_m2} (ref >59)
GFR calc non Af Amer: 72 mL/min/{1.73_m2} (ref >59)
Glucose: 114 mg/dL — ABNORMAL HIGH (ref 65–99)
Potassium: 4.9 mmol/L (ref 3.5–5.2)
Sodium: 139 mmol/L (ref 134–144)

## 2020-05-18 ENCOUNTER — Telehealth: Payer: Self-pay

## 2020-05-18 NOTE — Telephone Encounter (Signed)
Patient called to report an opening of her fasciotomy incision on right lower leg where a scab fell off. "Can see the white meat." Denies redness, pain, swelling, drainage. Advised patient to monitor for s/s of infection. Wash with mild soap and water, pat dry and cover with clean dry gauze. Knows to call office with further issues.

## 2020-05-31 ENCOUNTER — Encounter (HOSPITAL_COMMUNITY): Payer: Medicare HMO

## 2020-05-31 ENCOUNTER — Ambulatory Visit: Payer: Medicare HMO | Admitting: Vascular Surgery

## 2020-06-07 ENCOUNTER — Ambulatory Visit (HOSPITAL_COMMUNITY)
Admission: RE | Admit: 2020-06-07 | Discharge: 2020-06-07 | Disposition: A | Discharge status: Home / Self Care | Payer: Medicare HMO | Source: Ambulatory Visit | Attending: Vascular Surgery | Admitting: Vascular Surgery

## 2020-06-07 ENCOUNTER — Encounter: Payer: Self-pay | Admitting: Vascular Surgery

## 2020-06-07 ENCOUNTER — Other Ambulatory Visit: Payer: Self-pay

## 2020-06-07 ENCOUNTER — Ambulatory Visit (INDEPENDENT_AMBULATORY_CARE_PROVIDER_SITE_OTHER): Payer: Self-pay | Admitting: Vascular Surgery

## 2020-06-07 VITALS — BP 163/76 | HR 83 | Temp 97.9°F | Resp 20 | Ht 62.5 in | Wt 184.0 lb

## 2020-06-07 DIAGNOSIS — R6889 Other general symptoms and signs: Secondary | ICD-10-CM | POA: Diagnosis not present

## 2020-06-07 DIAGNOSIS — I96 Gangrene, not elsewhere classified: Secondary | ICD-10-CM

## 2020-06-07 DIAGNOSIS — Z09 Encounter for follow-up examination after completed treatment for conditions other than malignant neoplasm: Secondary | ICD-10-CM

## 2020-06-07 DIAGNOSIS — Z8616 Personal history of COVID-19: Secondary | ICD-10-CM

## 2020-06-07 NOTE — Progress Notes (Signed)
ASSESSMENT & PLAN:  60 y.o. female with dry gangrene of right toes 3-5 after RLE arterial thrombectomy 03/17/20. Angiogram 04/06/20 confirms peroneal only runoff to the foot, but reasonable flow to the foot. Fortunately, her toes are healing and her toe pressure has normalized. I counseled her to continue betadine to a small area of eschar in her lateral fasciotomy scar. Follow up with me in 1 year with repeat ABI.   CHIEF COMPLAINT:   Right third gangrene  HISTORY:  HISTORY OF PRESENT ILLNESS: Patricia Atkinson is a 60 y.o. female who underwent arterial thrombectomy and fasciotomies of her right lower extremity 03/17/20 for acute limb ischemia. Of note she was COVID (+) at the time. She did very well postoperatively. She was discharged after a typical postoperative course. At home she developed darkening of her third, fourth and fifth toes. She presents today for evaluation. She has been ambulatory without pain.   04/27/19: Returns for evaluation after angiogram 04/06/20. Toes unchanged.   06/07/20: returns for follow up. Looks and feels much better. Toes healing. Walking without difficulty. Small eschar in lateral fasciotomy she has been treating with local care.  Past Medical History:  Diagnosis Date  . Heart failure (HCC)    diagnosed around 2017 in [place]  . Hyperlipidemia   . Peripheral arterial disease [facility])     Past Surgical History:  Procedure Laterality Date  . ABDOMINAL AORTOGRAM W/LOWER EXTREMITY N/A 04/06/2020   Procedure: ABDOMINAL AORTOGRAM W/LOWER EXTREMITY;  Surgeon: Leonie Douglas, MD;  [facility]: [facility];  Service: Cardiovascular;  Laterality: N/A;  . THROMBECTOMY OF BYPASS GRAFT FEMORAL- TIBIAL ARTERY Right 03/17/2020   Procedure: ILIO-FEMORAL AND TIBIAL THROMBECTOMY WITH FOUR COMPARTMENT FASCIOTOMY;  Surgeon: Leonie Douglas, MD;  Location: [facility] OR;  Service: Vascular;  Laterality: Right;    Family History  Problem Relation Age of Onset  . Heart attack  Father   . Heart attack Paternal Grandmother     Social History   Socioeconomic History  . Marital status: Widowed    Spouse name: Not on file  . Number of children: Not on file  . Years of education: Not on file  . Highest education level: Not on file  Occupational History  . Not on file  Tobacco Use  . Smoking status: Never Smoker  . Smokeless tobacco: Never Used  Vaping Use  . Vaping Use: Never used  Substance and Sexual Activity  . Alcohol use: Yes    Comment: socially  . Drug use: Never  . Sexual activity: Not on file  Other Topics Concern  . Not on file  Social History Narrative  . Not on file   Social Determinants of Health   Financial Resource Strain: Not on file  Food Insecurity: Not on file  Transportation Needs: Not on file  Physical Activity: Not on file  Stress: Not on file  Social Connections: Not on file  Intimate Partner Violence: Not on file    No Known Allergies  Current Outpatient Medications  Medication Sig Dispense Refill  . albuterol (VENTOLIN HFA) 108 (90 Base) MCG/ACT inhaler Inhale 2 puffs into the lungs every 4 (four) hours as needed for wheezing or shortness of breath. 6.7 g 1  . apixaban (ELIQUIS) 5 MG TABS tablet Take 1 tablet (5 mg total) by mouth 2 (two) times daily. 60 tablet 2  . aspirin EC 81 MG EC tablet Take 1 tablet (81 mg total) by mouth daily at 6 (six) AM. Swallow whole.  30 tablet 11  . carvedilol (COREG) 12.5 MG tablet Take 1 tablet (12.5 mg total) by mouth 2 (two) times daily with a meal. 60 tablet 0  . furosemide (LASIX) 40 MG tablet Take 1-2 tablets (40-80 mg total) by mouth See admin instructions. 80 MG in the morning and 40 MG in the evening 90 tablet 0  . oxyCODONE (OXY IR/ROXICODONE) 5 MG immediate release tablet Take 1 tablet (5 mg total) by mouth every 6 (six) hours as needed for moderate pain. 20 tablet 0  . simvastatin (ZOCOR) 40 MG tablet Take 1 tablet (40 mg total) by mouth daily. 30 tablet 0  . spironolactone  (ALDACTONE) 25 MG tablet Take 1 tablet (25 mg total) by mouth daily. 30 tablet 0   No current facility-administered medications for this visit.    REVIEW OF SYSTEMS:  [X]  denotes positive finding, [ ]  denotes negative finding Cardiac  Comments:  Chest pain or chest pressure:    Shortness of breath upon exertion:    Short of breath when lying flat:    Irregular heart rhythm:        Vascular    Pain in calf, thigh, or hip brought on by ambulation:    Pain in feet at night that wakes you up from your sleep:     Blood clot in your veins:    Leg swelling:         Pulmonary    Oxygen at home:    Productive cough:     Wheezing:         Neurologic    Sudden weakness in arms or legs:     Sudden numbness in arms or legs:     Sudden onset of difficulty speaking or slurred speech:    Temporary loss of vision in one eye:     Problems with dizziness:         Gastrointestinal    Blood in stool:     Vomited blood:         Genitourinary    Burning when urinating:     Blood in urine:        Psychiatric    Major depression:         Hematologic    Bleeding problems:    Problems with blood clotting too easily:        Skin    Rashes or ulcers:        Constitutional    Fever or chills:     PHYSICAL EXAM:   BP (!) 163/76 (BP Location: Left Arm, Patient Position: Sitting, Cuff Size: Normal)   Pulse 83   Temp 97.9 F (36.6 C)   Resp 20   Ht 5' 2.5" (1.588 m)   Wt 184 lb (83.5 kg)   SpO2 97%   BMI 33.12 kg/m   Constitutional: Well appearing in no distress. Appears well nourished.  Neurologic: Normal gait and station. CN intact. No weakness. No sensory loss. Psychiatric: Mood and affect symmetric and appropriate. Eyes: No icterus. No conjunctival pallor. Ears, nose, throat: mucous membranes moist. Midline trachea. No carotid bruit. Cardiac: regular rate and rhythm.  Respiratory: unlabored. Abdominal: soft, non-tender, non-distended. No palpable pulsatile abdominal  mass. Peripheral vascular:  No palpable pedal pulses  Small eschar <1cm with fibrinous exudate lateral fasciotomy incision  Healing toes 3-5  Extremity: No edema. No cyanosis. No pallor.  Skin: improved / nearly resolved dry gangrene of right toe tips 3-5. No ulceration.  Lymphatic: No Stemmer's sign. No  palpable lymphadenopathy.   DATA REVIEW:    Most recent CBC CBC Latest Ref Rng & Units 04/06/2020 03/20/2020 03/19/2020  WBC 4.0 - 10.5 K/uL - 9.9 10.1  Hemoglobin 12.0 - 15.0 g/dL 4.9(S) 4.9(Q) 10.8(L)  Hematocrit 36.0 - 46.0 % 29.0(L) 29.8(L) 34.1(L)  Platelets 150 - 400 K/uL - 310 324     Most recent CMP CMP Latest Ref Rng & Units 05/11/2020 04/06/2020 03/22/2020  Glucose 65 - 99 mg/dL 759(F) 638(G) 665(L)  BUN 8 - 27 mg/dL 15 6(L) 93(T)  Creatinine 0.57 - 1.00 mg/dL 7.01 7.79 3.90(Z)  Sodium 134 - 144 mmol/L 139 143 138  Potassium 3.5 - 5.2 mmol/L 4.9 3.3(L) 4.3  Chloride 96 - 106 mmol/L 102 105 106  CO2 20 - 29 mmol/L 25 - 20(L)  Calcium 8.7 - 10.3 mg/dL 00.9 - 8.4(L)  Total Protein 6.5 - 8.1 g/dL - - 5.8(L)  Total Bilirubin 0.3 - 1.2 mg/dL - - 1.0  Alkaline Phos 38 - 126 U/L - - 195(H)  AST 15 - 41 U/L - - 58(H)  ALT 0 - 44 U/L - - 42    Renal function CrCl cannot be calculated (Patient's most recent lab result is older than the maximum 21 days allowed.).  Hgb A1c MFr Bld (%)  Date Value  03/18/2020 6.0 (H)    LDL Cholesterol  Date Value Ref Range Status  03/18/2020 121 (H) 0 - 99 mg/dL Final    Comment:           Total Cholesterol/HDL:CHD Risk Coronary Heart Disease Risk Table                     Men   Women  1/2 Average Risk   3.4   3.3  Average Risk       5.0   4.4  2 X Average Risk   9.6   7.1  3 X Average Risk  23.4   11.0        Use the calculated Patient Ratio above and the CHD Risk Table to determine the patient's CHD Risk.        ATP III CLASSIFICATION (LDL):  <100     mg/dL   Optimal  233-007  mg/dL   Near or Above                     Optimal  130-159  mg/dL   Borderline  622-633  mg/dL   High  >354     mg/dL   Very High Performed at [facility], [address], [place], [place] [address]      ABI 06/07/20   Rande Brunt. Lenell Antu, MD [facility] Phone Number: [phone] 06/07/2020 9:24 AM

## 2020-06-10 ENCOUNTER — Other Ambulatory Visit: Payer: Self-pay

## 2020-06-10 ENCOUNTER — Telehealth: Payer: Self-pay | Admitting: Cardiology

## 2020-06-10 MED ORDER — SIMVASTATIN 40 MG PO TABS: 40.0000 mg | ORAL_TABLET | Freq: Every day | ORAL | 0 refills | Status: DC

## 2020-06-10 MED ORDER — FUROSEMIDE 40 MG PO TABS: 40.0000 mg | ORAL_TABLET | ORAL | 0 refills | Status: DC

## 2020-06-10 MED ORDER — CARVEDILOL 12.5 MG PO TABS: 12.5000 mg | ORAL_TABLET | Freq: Two times a day (BID) | ORAL | 3 refills | Status: DC

## 2020-06-10 MED ORDER — SPIRONOLACTONE 25 MG PO TABS: 25.0000 mg | ORAL_TABLET | Freq: Every day | ORAL | 0 refills | Status: DC

## 2020-06-10 MED ORDER — LOSARTAN POTASSIUM 25 MG PO TABS: 25.0000 mg | ORAL_TABLET | Freq: Every day | ORAL | 3 refills | Status: DC

## 2020-06-10 MED ORDER — CARVEDILOL 12.5 MG PO TABS: 12.5000 mg | ORAL_TABLET | Freq: Two times a day (BID) | ORAL | 5 refills | Status: DC

## 2020-06-10 NOTE — Telephone Encounter (Signed)
   *  STAT* If patient is at the pharmacy, call can be transferred to refill team.   1. Which medications need to be refilled? (please list name of each medication and dose if known)   carvedilol (COREG) 12.5 MG tablet  furosemide (LASIX) 40 MG tablet  simvastatin (ZOCOR) 40 MG tablet    spironolactone (ALDACTONE) 25 MG tablet    2. Which pharmacy/location (including street and city if local pharmacy) is medication to be sent to? [facility] - [place], [place] - [address]  3. Do they need a 30 day or 90 day supply? 90 days

## 2020-06-10 NOTE — Telephone Encounter (Signed)
     Pt c/o medication issue:  1. Name of Medication: losartan  2. How are you currently taking this medication (dosage and times per day)?   3. Are you having a reaction (difficulty breathing--STAT)?   4. What is your medication issue? Pt said she have this medication on her meds list on her AVS from the hospital and last OV with Dr. Antoine Poche and requesting for a refill. It is not on her meds list anymore. She wanted to know if she needs to take this medications

## 2020-06-10 NOTE — Telephone Encounter (Signed)
    Pt is returning call, she did confirm she's been taking her losartan. She also got notification that her prescription been refilled but her carvedilol is not there. She would like to follow up on that

## 2020-06-10 NOTE — Telephone Encounter (Signed)
Spoke with Edd Fabian, NP- he states that per his last note he she was to continue the medication.  I did call patient to verify she has been taking the medication currently but did go ahead and send in refills as well to pharmacy.  Patient given call back number.

## 2020-06-10 NOTE — Telephone Encounter (Signed)
Patient last seen by you, can you please verify if patient should still be taking Losartan?   It was removed from list, but at last visit it was on med list. I can send in refills just wanted to make sure it shouldn't have been stopped.  Thank you!

## 2020-06-10 NOTE — Telephone Encounter (Signed)
Called pt, medication refill sent in to her pharmacy. Pt verbalizes understanding.

## 2020-07-28 ENCOUNTER — Telehealth: Payer: Self-pay | Admitting: Cardiology

## 2020-07-28 MED ORDER — SPIRONOLACTONE 25 MG PO TABS: 25.0000 mg | ORAL_TABLET | Freq: Every day | ORAL | 2 refills | Status: DC

## 2020-07-28 MED ORDER — SIMVASTATIN 40 MG PO TABS: 40.0000 mg | ORAL_TABLET | Freq: Every day | ORAL | 2 refills | Status: DC

## 2020-07-28 MED ORDER — FUROSEMIDE 40 MG PO TABS: 40.0000 mg | ORAL_TABLET | ORAL | 2 refills | Status: DC

## 2020-07-28 NOTE — Telephone Encounter (Signed)
Coreg + losartan send 02/22-1 year supply Other rx sent to pharmacy  Patient aware.

## 2020-07-28 NOTE — Telephone Encounter (Signed)
*  STAT* If patient is at the pharmacy, call can be transferred to refill team.   1. Which medications need to be refilled? (please list name of each medication and dose if known)  carvedilol (COREG) 12.5 MG tablet; losartan (COZAAR) 25 MG tablet  2. Which pharmacy/location (including street and city if local pharmacy) is medication to be sent to? [facility] - [place], [place] - [address]  3. Do they need a 30 day or 90 day supply? 90 for both      Pt c/o medication issue:  1. Name of Medication: furosemide (LASIX) 40 MG tablet; simvastatin (ZOCOR) 40 MG tablet; spironolactone (ALDACTONE) 25 MG tablet  2. How are you currently taking this medication (dosage and times per day)? As written  3. Are you having a reaction (difficulty breathing--STAT)? No   4. What is your medication issue? Patient out of medication and needs new prescription for the above medications asap; please send to [id number] - [place], [place] - [address] for 90 day supply

## 2020-08-04 ENCOUNTER — Other Ambulatory Visit: Payer: Self-pay

## 2020-08-04 MED ORDER — CARVEDILOL 12.5 MG PO TABS: 12.5000 mg | ORAL_TABLET | Freq: Two times a day (BID) | ORAL | 3 refills | Status: AC

## 2020-08-04 MED ORDER — FUROSEMIDE 40 MG PO TABS: 40.0000 mg | ORAL_TABLET | ORAL | 2 refills | Status: AC

## 2020-08-04 MED ORDER — LOSARTAN POTASSIUM 25 MG PO TABS: 25.0000 mg | ORAL_TABLET | Freq: Every day | ORAL | 3 refills | Status: AC

## 2020-08-15 ENCOUNTER — Other Ambulatory Visit: Payer: Self-pay | Admitting: *Deleted

## 2020-08-15 MED ORDER — SPIRONOLACTONE 25 MG PO TABS: 25.0000 mg | ORAL_TABLET | Freq: Every day | ORAL | 0 refills | Status: DC

## 2020-08-15 MED ORDER — SIMVASTATIN 40 MG PO TABS: 40.0000 mg | ORAL_TABLET | Freq: Every day | ORAL | 0 refills | Status: DC

## 2020-08-15 NOTE — Telephone Encounter (Signed)
Received refill request from [facility] for Spironolactone and Simvastatin, Rx sent

## 2020-10-04 ENCOUNTER — Telehealth: Payer: Self-pay | Admitting: *Deleted

## 2020-10-04 NOTE — Telephone Encounter (Signed)
I spoke with Patricia Atkinson,she stated she's moving from [place]. Recall removed.

## 2020-10-19 ENCOUNTER — Telehealth: Payer: Self-pay

## 2020-10-19 NOTE — Telephone Encounter (Signed)
Per staff message from [facility], patient no longer needs to take Eliquis. Left message on VM to inform patient.

## 2021-03-02 ENCOUNTER — Other Ambulatory Visit: Payer: Self-pay | Admitting: Cardiology

## 2022-08-26 IMAGING — CT CT ANGIO AOBIFEM WO/W CM
2 of 6 series · 10 of 46 positions shown, 11 images · IV contrast (omnipaque)
Comparison: None.

CLINICAL DATA: Right knee pain with right leg and foot numbness for
several hours. Elevated D-dimer. No pulses in the right leg.

EXAM:
CT ANGIOGRAPHY OF ABDOMINAL AORTA WITH ILIOFEMORAL RUNOFF
TECHNIQUE: Multidetector CT imaging of the abdomen, pelvis and lower
extremities was performed using the standard protocol during bolus
administration of intravenous contrast. Multiplanar CT image
reconstructions and MIPs were obtained to evaluate the vascular
anatomy.
CONTRAST:  80mL OMNIPAQUE IOHEXOL 350 MG/ML SOLN

[Series 4: axial arterial upper · axial · arterial · 0.88mm/px · z∈[+399,+1452]mm · 7 of 425 slices shown, 8 images]
[im 44/425  soft-tissue]
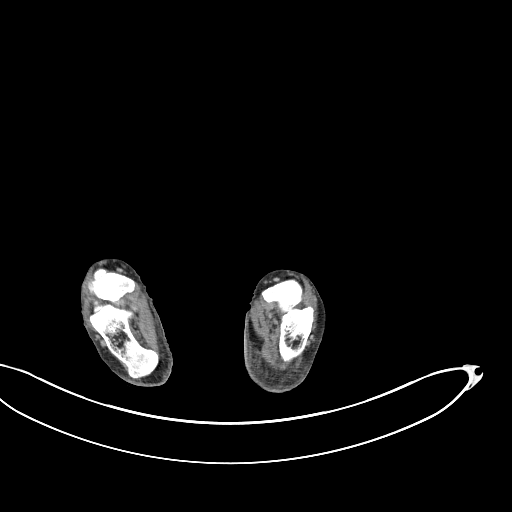
[im 44/425  bone]
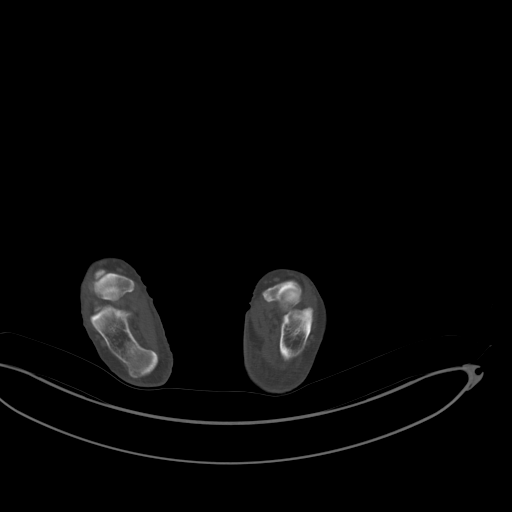
[im 103/425  soft-tissue]
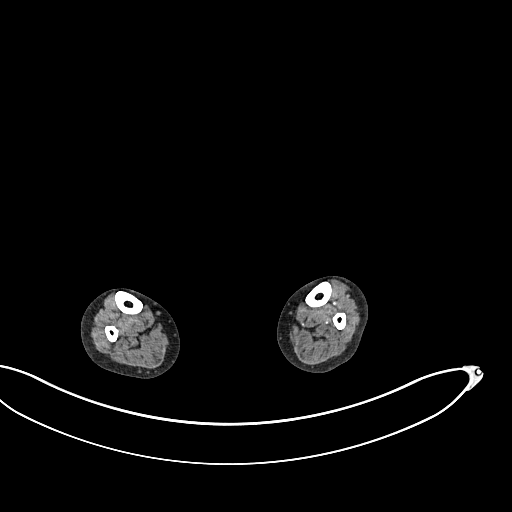
[im 161/425  soft-tissue]
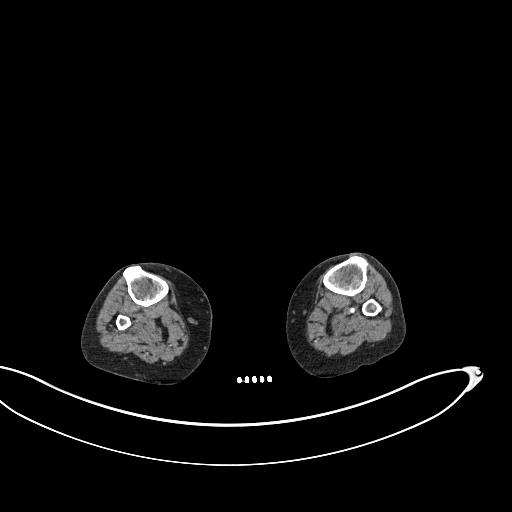
[im 220/425  soft-tissue]
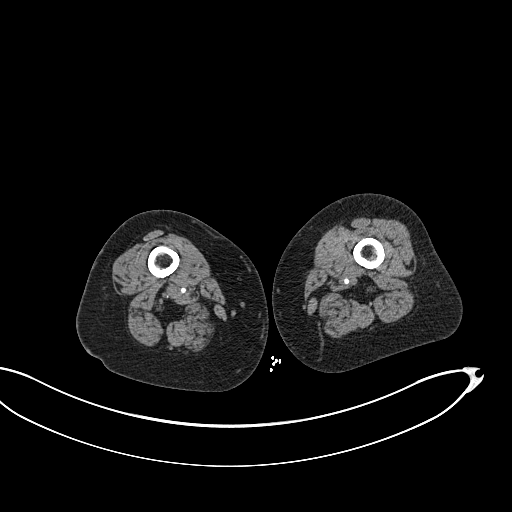
[im 278/425  soft-tissue]
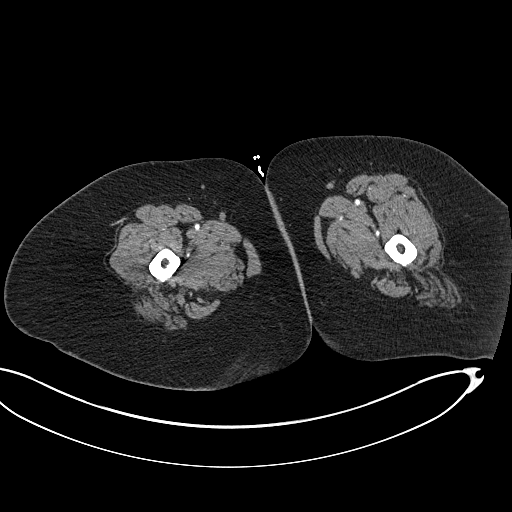
[im 337/425  soft-tissue]
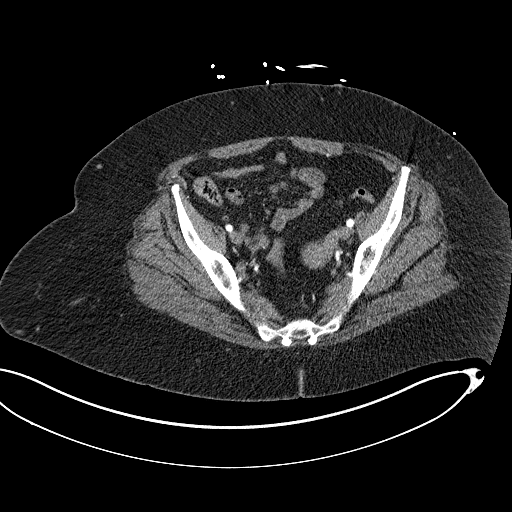
[im 395/425  soft-tissue]
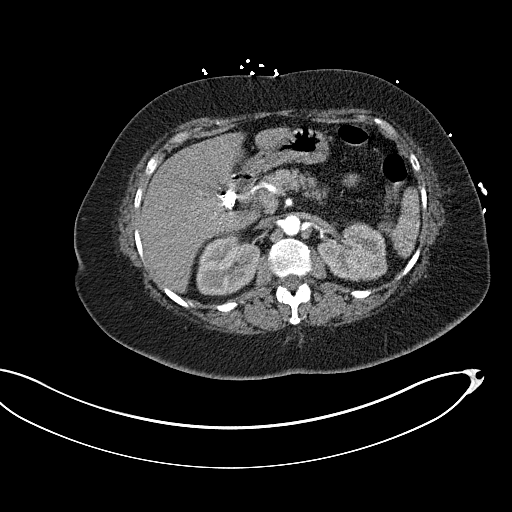

[Series 5: coronal upper · coronal · 0.84mm/px · 3 of 149 slices shown]
[im 38/149  soft-tissue]
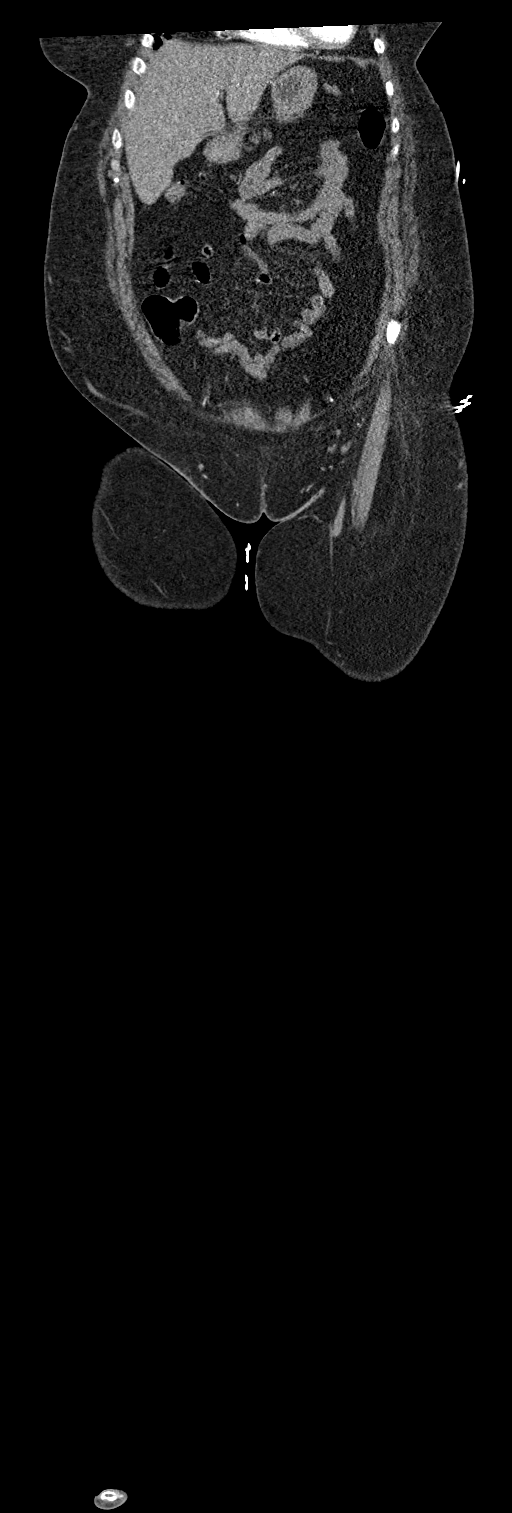
[im 75/149  soft-tissue]
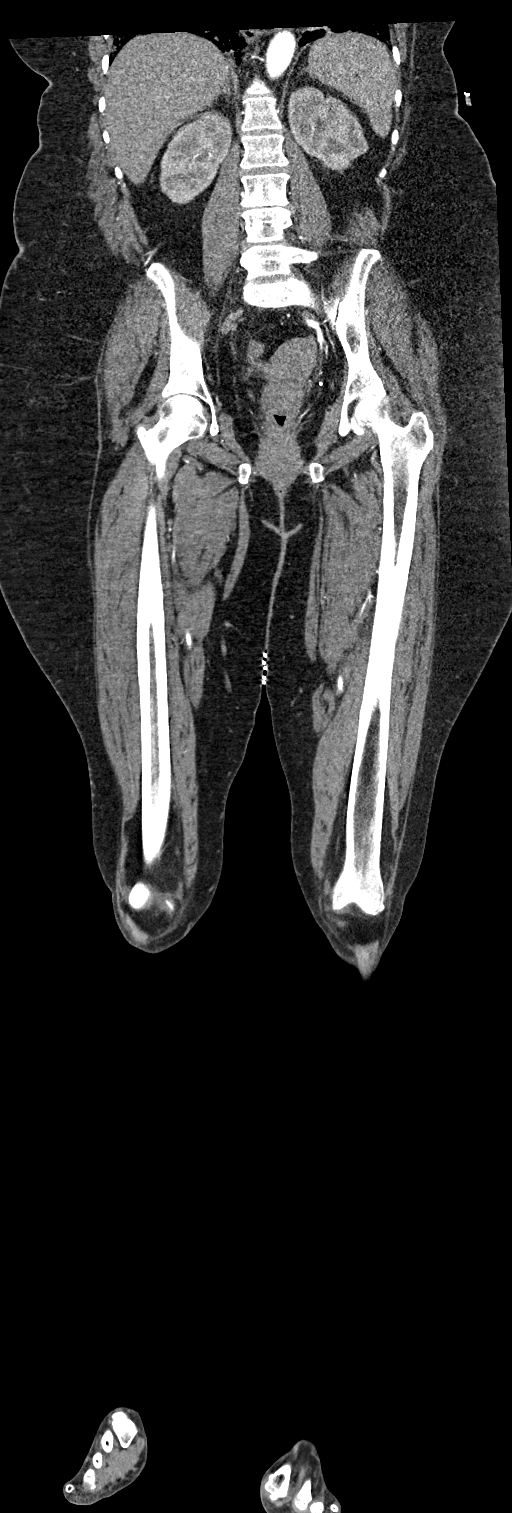
[im 112/149  soft-tissue]
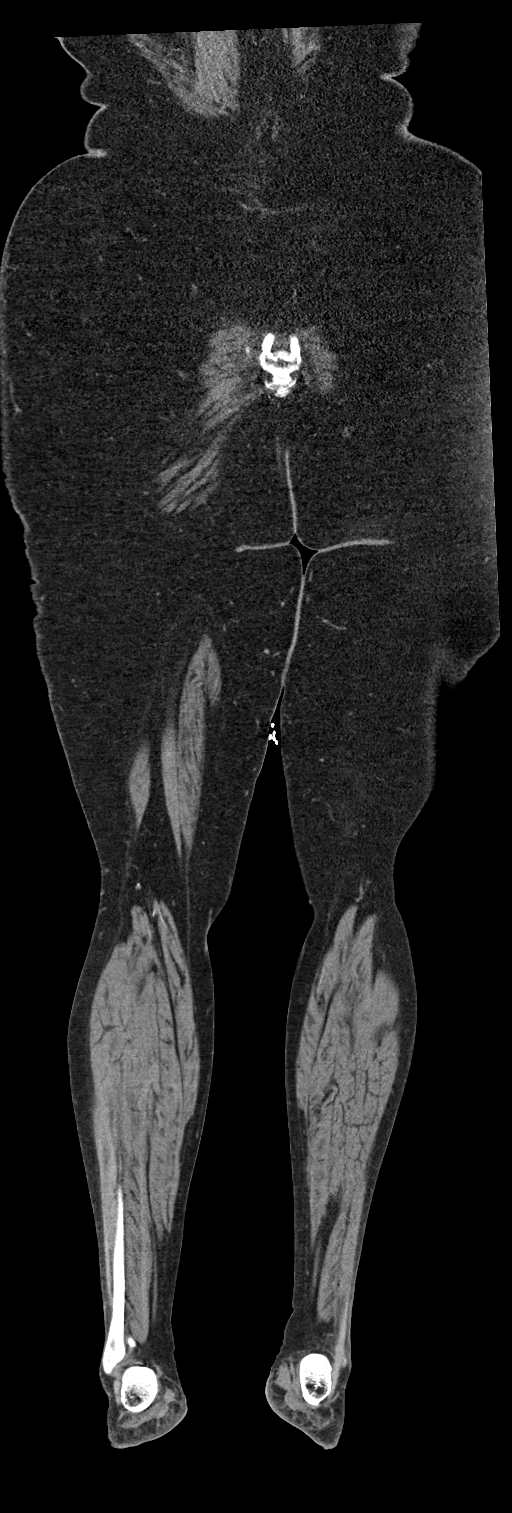

[10 of 46 positions shown; findings below may reference images not displayed]

FINDINGS: VASCULAR

Aorta: Normal caliber aorta without aneurysm, dissection, vasculitis
or significant stenosis. Scattered aortic calcifications.

Celiac: Patent without evidence of aneurysm, dissection, vasculitis
or significant stenosis.

SMA: Patent without evidence of aneurysm, dissection, vasculitis or
significant stenosis.

Renals: Both renal arteries are patent without evidence of aneurysm,
dissection, vasculitis, fibromuscular dysplasia or significant
stenosis. There is a small accessory right renal artery to the lower
pole which also appears patent.

IMA: Patent without evidence of aneurysm, dissection, vasculitis or
significant stenosis.

RIGHT Lower Extremity

Inflow: Thrombosis demonstrated in the common iliac artery extending
into the proximal external iliac artery and into the internal iliac
artery. There is a small string sign of flow around the thrombus in
the common iliac and external iliac arteries with no flow
demonstrated in the internal iliac artery. There is collateral
reconstitution of flow to the external iliac artery.

Outflow: Common, superficial and profunda femoral arteries and the
popliteal artery are patent without evidence of aneurysm,
dissection, vasculitis or significant stenosis.

Runoff: Nonocclusive filling defect in the popliteal artery probably
thrombus. There is complete occlusion of the distal popliteal artery
with no flow demonstrated in the tibial trunk or trifurcation
vessels. 0 vessel runoff to the ankle is demonstrated.

LEFT Lower Extremity

Inflow: Common, internal and external iliac arteries are patent
without evidence of aneurysm, dissection, vasculitis or significant
stenosis.

Outflow: Common, superficial and profunda femoral arteries and the
popliteal artery are patent without evidence of aneurysm,
dissection, vasculitis or significant stenosis.

Runoff: Patent three vessel runoff to the ankle.

Veins: No obvious venous abnormality within the limitations of this
arterial phase study.

Review of the MIP images confirms the above findings.

NON-VASCULAR

Lower chest: Diffuse airspace disease in the visualized lung bases,
likely edema. Pneumonia would be a secondary consideration.

Hepatobiliary: No focal liver abnormality is seen. Status post
cholecystectomy. No biliary dilatation.

Pancreas: Unremarkable. No pancreatic ductal dilatation or
surrounding inflammatory changes.

Spleen: Normal in size without focal abnormality.

Adrenals/Urinary Tract: Adrenal glands are unremarkable. Kidneys are
normal, without renal calculi, focal lesion, or hydronephrosis.
Bladder is unremarkable.

Stomach/Bowel: Stomach is within normal limits. Appendix appears
normal. No evidence of bowel wall thickening, distention, or
inflammatory changes.

Lymphatic: No significant lymphadenopathy.

Reproductive: Uterus and bilateral adnexa are unremarkable.

Other: No abdominal wall hernia or abnormality. No abdominopelvic
ascites.

Musculoskeletal: No acute or significant osseous findings.
IMPRESSION: VASCULAR

Occlusive filling defect, likely thrombosis in the right common
iliac artery extending into the external iliac and internal iliac
arteries. Collateral reconstitution of the external iliac artery.
Focal nonocclusive filling defect in the right mid popliteal artery.
Occlusion of the distal popliteal artery extending into the tibial
trunk and trifurcation vessels with no flow demonstrated in the
right calf runoff vessels.

NON-VASCULAR

Diffuse airspace disease in the visualized lung bases is likely due
to edema.

## 2022-08-26 IMAGING — DX DG CHEST 1V PORT
1 series · 1 of 1 positions shown · non-contrast
Comparison: None.

CLINICAL DATA: CHF

EXAM:
PORTABLE CHEST 1 VIEW

[chest ap]
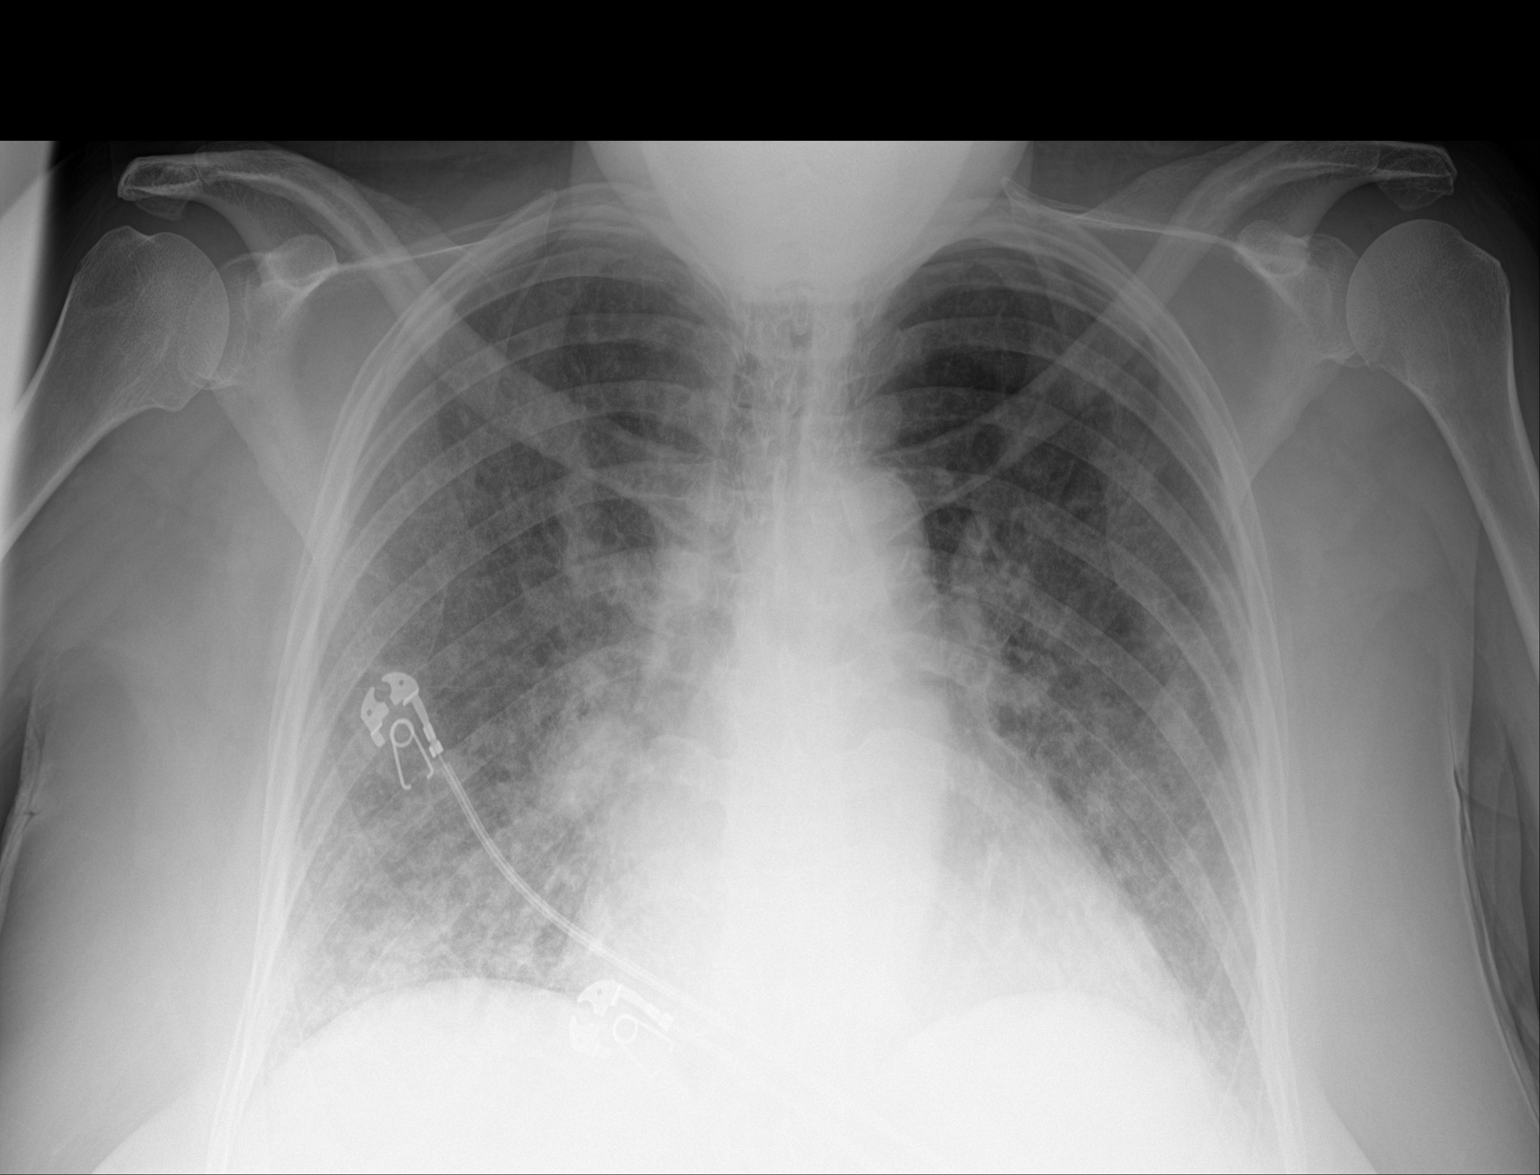

[1 of 1 positions shown; findings below may reference images not displayed]

FINDINGS: Diffuse interstitial prominence with patchy perihilar/bibasilar
opacities. No pneumothorax or pleural effusion. Cardiomegaly. No
acute osseous abnormality.
IMPRESSION: Mild pulmonary edema and cardiomegaly.

## 2022-08-28 IMAGING — DX DG CHEST 1V
1 series · 1 of 1 positions shown · non-contrast
Comparison: None.

CLINICAL DATA: Shortness of breath.

EXAM:
CHEST  1 VIEW

[chest ap]
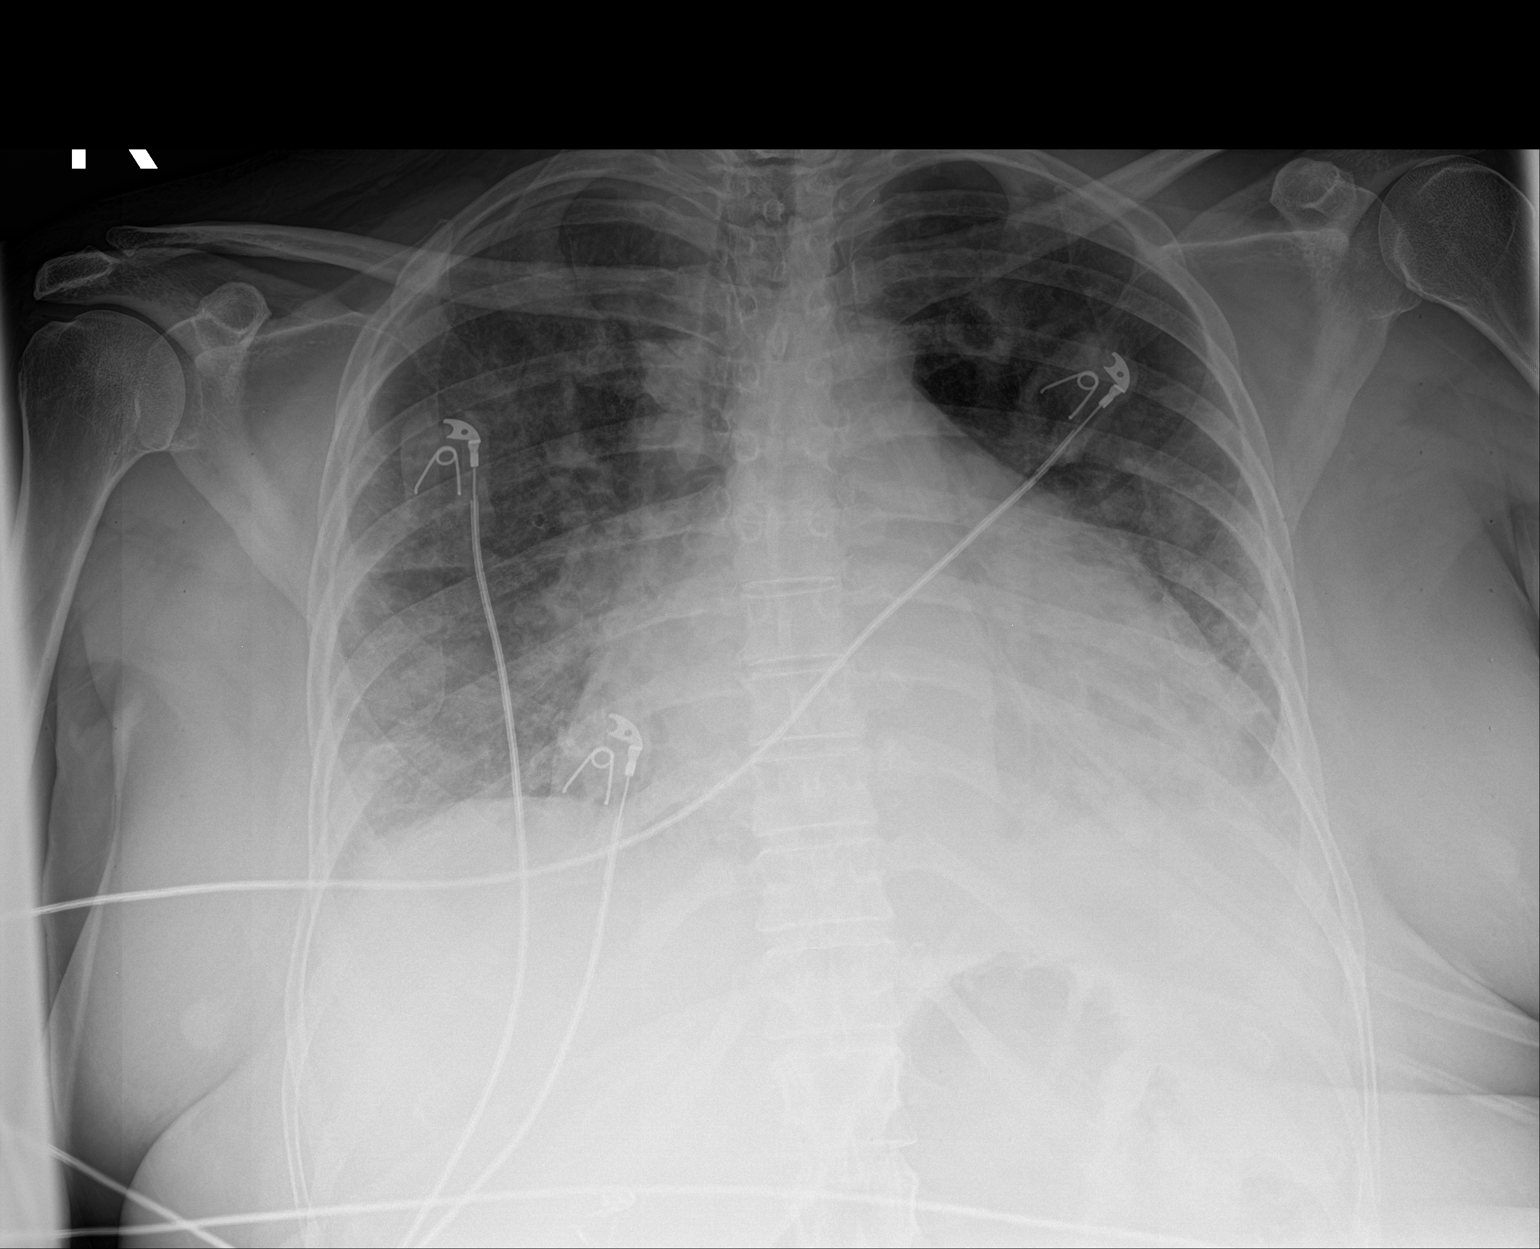

[1 of 1 positions shown; findings below may reference images not displayed]

FINDINGS: Persistent mild to moderate severity increased interstitial
prominence is noted. Mild areas of atelectasis and/or infiltrate are
also seen within the bilateral lung bases left greater than right.
This is increased in severity within the left lung base when
compared to the prior study. There is no evidence of a pleural
effusion or pneumothorax. The cardiac silhouette is moderately
enlarged. The visualized skeletal structures are unremarkable.
IMPRESSION: Persistent pulmonary edema with mild bibasilar atelectasis and/or
infiltrate, left greater than right.
